# Patient Record
Sex: Female | Born: 1955 | Race: White | Hispanic: No | Marital: Married | Smoking: Never smoker
Health system: Southern US, Community
[De-identification: ages and names within clinical notes are randomized; demographics above are authoritative.]

## PROBLEM LIST (undated history)

## (undated) DIAGNOSIS — I10 Essential (primary) hypertension: Secondary | ICD-10-CM

## (undated) HISTORY — DX: Essential (primary) hypertension: I10

---

## 2003-02-27 HISTORY — PX: ROTATOR CUFF REPAIR: SHX139

## 2012-01-09 DIAGNOSIS — K635 Polyp of colon: Secondary | ICD-10-CM | POA: Insufficient documentation

## 2013-01-28 DIAGNOSIS — Z800 Family history of malignant neoplasm of digestive organs: Secondary | ICD-10-CM | POA: Insufficient documentation

## 2013-01-28 DIAGNOSIS — E559 Vitamin D deficiency, unspecified: Secondary | ICD-10-CM | POA: Insufficient documentation

## 2015-12-29 ENCOUNTER — Ambulatory Visit (INDEPENDENT_AMBULATORY_CARE_PROVIDER_SITE_OTHER): Payer: BLUE CROSS/BLUE SHIELD | Admitting: Osteopathic Medicine

## 2015-12-29 ENCOUNTER — Encounter: Payer: Self-pay | Admitting: Osteopathic Medicine

## 2015-12-29 VITALS — BP 155/80 | HR 68 | Ht 63.0 in | Wt 143.0 lb

## 2015-12-29 DIAGNOSIS — R030 Elevated blood-pressure reading, without diagnosis of hypertension: Secondary | ICD-10-CM

## 2015-12-29 DIAGNOSIS — IMO0001 Reserved for inherently not codable concepts without codable children: Secondary | ICD-10-CM

## 2015-12-29 DIAGNOSIS — M7672 Peroneal tendinitis, left leg: Secondary | ICD-10-CM

## 2015-12-29 DIAGNOSIS — L821 Other seborrheic keratosis: Secondary | ICD-10-CM | POA: Diagnosis not present

## 2015-12-29 MED ORDER — MELOXICAM 7.5 MG PO TABS: 7.5000 mg | ORAL_TABLET | Freq: Every day | ORAL | Status: DC | PRN

## 2015-12-29 NOTE — Progress Notes (Signed)
HPI: Kathleen Kennedy is a 60 y.o. female who presents to [facility] today for chief complaint of:  Chief Complaint  Patient presents with  . Establish Care  . Ankle Pain    ANKLE PAIN . Location: L ankle, posterior to lateral malleolus, not involving achilles tendon . Quality: sore, sharp pain . Duration: started 06/2015 . Timing: worst when walking down stairs . Context: no injury that sh can recall  SPOT ON LEG . Location: L anterior thigh . Quality: dry brownish spot, noticed getting a bit flaky and irritated . Severity: mild . Duration: been there few years . Assoc signs/symptoms: No redness or ulceration  HYPERTENSION - previously on medications however after increasing exercise and losing weight she was discontinued on these meds. No chest pain, pressure, shortness of breath.     Past medical, social and family history reviewed: Past Medical History  Diagnosis Date  . Hypertension    Past Surgical History  Procedure Laterality Date  . Rotator cuff repair  APRIL 2004   Social History  Substance Use Topics  . Smoking status: Not on file  . Smokeless tobacco: Not on file  . Alcohol Use: Not on file   Family History  Problem Relation Age of Onset  . Cancer Mother     SKIN  . Cancer Father     COLON  . Hyperlipidemia Father   . Diabetes Brother     No current outpatient prescriptions on file.   No current facility-administered medications for this visit.   Allergies  Allergen Reactions  . Codeine       Review of Systems: CONSTITUTIONAL:  No  fever, no chills, No  unintentional weight changes HEAD/EYES/EARS/NOSE/THROAT: No  headache, no vision change, no hearing change, No  sore throat, No  sinus pressure CARDIAC: No  chest pain, No  pressure, No palpitations, No  orthopnea RESPIRATORY: No  cough, No  shortness of breath/wheeze GASTROINTESTINAL: No  nausea, No  vomiting, No  abdominal pain, No  blood in stool, No   diarrhea, No  constipation  MUSCULOSKELETAL: (+) myalgia/arthralgia see history of present illness GENITOURINARY: No  incontinence, No  abnormal genital bleeding/discharge SKIN: (+) rash/wounds/concerning lesions see history of present illness HEM/ONC: No  easy bruising/bleeding, No  abnormal lymph node ENDOCRINE: No polyuria/polydipsia/polyphagia, No  heat/cold intolerance  NEUROLOGIC: No  weakness, No  dizziness, No  slurred speech PSYCHIATRIC: No  concerns with depression, No  concerns with anxiety, No sleep problems  Exam:  BP 155/80 mmHg  Pulse 68  Ht  (1.6 m)  Wt 143 lb (64.864 kg)  BMI 25.34 kg/m2 Constitutional: VS see above. General Appearance: alert, well-developed, well-nourished, NAD Eyes: Normal lids and conjunctive, non-icteric sclera, PERRLA Ears, Nose, Mouth, Throat: MMM, Normal external inspection ears/nares/mouth/lips/gums, TM normal bilaterally. Pharynx no erythema, no exudate.  Neck: No masses, trachea midline. No thyroid enlargement/tenderness/mass appreciated. No lymphadenopathy Respiratory: Normal respiratory effort. no wheeze, no rhonchi, no rales Cardiovascular: S1/S2 normal, no murmur, no rub/gallop auscultated. RRR.  Gastrointestinal: Nontender, no masses. No hepatomegaly, no splenomegaly. No hernia appreciated. Bowel sounds normal. Rectal exam deferred.  Musculoskeletal: Gait normal. No clubbing/cyanosis of digits. Ankle drawer test negative, no tenderness to lateral malleolus or Achilles tendon. No ligamentous laxity. Neurological: No cranial nerve deficit on limited exam. Motor and sensation intact and symmetric Skin: warm, dry, intact. No rash/ulcer. No concerning nevi or subq nodules on limited exam.  Small irritated seborrheic keratosis on left thigh Psychiatric: Normal judgment/insight. Normal  mood and affect. Oriented x3.    No results found for this or any previous visit (from the past 72 hour(s)).    ASSESSMENT/PLAN:   Peroneal tendinitis of  lower leg, left - Plan: meloxicam (MOBIC) 7.5 MG tablet - rehabilitation exercises given to patient and patient instructions, will consider physical therapy if no improvement. Medications as needed  Seborrheic keratosis - bothering her can perform cryotherapy, patient declines this today.  Elevated blood pressure - patient has home blood pressure cuff, advised on measuring home BP and breathing based office with blood pressure cuff next week.   Return in about 1 day (around 12/30/2015) for BLOOD PRESSURE RECHECK, BRING HOME CUFF AND HOME BP MEASUREMENTS .

## 2015-12-29 NOTE — Patient Instructions (Addendum)
Peroneal Tendinitis With Rehab Tendonitis is inflammation of a tendon. Inflammation of the tendons on the back of the outer ankle (peroneal tendons) is known as peroneal tendonitis. The peroneal tendons are responsible for connecting the muscles that allow you to stand on your tiptoes to the bones of the ankle. For this reason, peroneal tendonitis often causes pain when trying to complete such motions. Peroneal tendonitis often involves a tear (strain) of the peroneal tendons. Strains are classified into three categories. Grade 1 strains cause pain, but the tendon is not lengthened. Grade 2 strains include a lengthened ligament, due to the ligament being stretched or partially ruptured. With grade 2 strains there is still function, although function may be decreased. Grade 3 strains involve a complete tear of the tendon or muscle, and function is usually impaired. SYMPTOMS   Pain, tenderness, swelling, warmth, or redness over the back of the outer side of the ankle, the outer part of the mid-foot, or the bottom of the arch.  Pain that gets worse with ankle motion (especially when pushing off or pushing down with the front of the foot), or when standing on the ball of the foot or pushing the foot outward.  Crackling sound (crepitation) when the tendon is moved or touched. CAUSES  Peroneal tendinitis occurs when injury to the peroneal tendons causes the body to respond with inflammation. Common causes of injury include:  An overuse injury, in which the groove behind the outer ankle (where the tendon is located) causes wear on the tendon.  A sudden stress placed on the tendon, such as from an increase in the intensity, frequency, or duration of training.  Direct hit (trauma) to the tendon.  Return to activity too soon after a previous ankle injury. RISK INCREASES WITH:  Sports that require sudden, repetitive pushing off of the foot, such as jumping or quick starts.  Kicking and running sports,  especially running down hills or long distances.  Poor strength and flexibility.  Previous injury to the foot, ankle, or leg. PREVENTION  Warm up and stretch properly before activity.  Allow for adequate recovery between workouts.  Maintain physical fitness:  Strength, flexibility, and endurance.  Cardiovascular fitness.  Complete rehabilitation after previous injury. PROGNOSIS  If treated properly, peroneal tendonitis usually heals within 6 weeks.  RELATED COMPLICATIONS  Longer healing time, if not properly treated or if not given enough time to heal.  Recurring symptoms if activity is resumed too soon, with overuse, or when using poor technique.  If untreated, tendinitis may result in tendon rupture, requiring surgery. TREATMENT  Treatment first involves the use of ice and medicine to reduce pain and inflammation. The use of strengthening and stretching exercises may help reduce pain with activity. These exercises may be performed at unsuccessful, surgery to remove the inflamed tendon lining (sheath) may be advised.  MEDICATION   If pain medicine is needed, nonsteroidal anti-inflammatory medicines (aspirin and ibuprofen), or other minor pain relievers (acetaminophen), are often advised.  Do not take pain medicine for 7 days before surgery.  Prescription pain relievers may be given, if your caregiver thinks they are needed. Use only as directed and only as much as you need. HEAT AND COLD  Cold treatment (icing) should be applied for 10 to 15 minutes every 2 to 3 hours for inflammation and pain, and immediately after activity that aggravates your symptoms. Use ice packs or an ice massage.  Heat treatment may be used before performing stretching and strengthening activities prescribed by your   caregiver, physical therapist, or athletic trainer. Use a heat pack or a warm water soak. SEEK MEDICAL CARE IF:  Symptoms get worse or do not improve in 2 to 4 weeks, despite  treatment.  New, unexplained symptoms develop. (Drugs used in treatment may produce side effects.) EXERCISES RANGE OF MOTION (ROM) AND STRETCHING EXERCISES - Peroneal Tendinitis These exercises may help you when beginning to rehabilitate your injury. Your symptoms may resolve with or without further involvement from your physician, physical therapist or athletic trainer. While completing these exercises, remember:   Restoring tissue flexibility helps normal motion to return to the joints. This allows healthier, less painful movement and activity.  An effective stretch should be held for at least 30 seconds.  A stretch should never be painful. You should only feel a gentle lengthening or release in the stretched tissue. RANGE OF MOTION - Ankle Eversion  Sit with your right / left ankle crossed over your opposite knee.  Grip your foot with your opposite hand, placing your thumb on the top of your foot and your fingers across the bottom of your foot.  Gently push your foot downward with a slight rotation, so your littlest toes rise slightly toward the ceiling.  You should feel a gentle stretch on the inside of your ankle. Hold the stretch for __________ seconds. Repeat __________ times. Complete this exercise __________ times per day.  RANGE OF MOTION - Ankle Inversion  Sit with your right / left ankle crossed over your opposite knee.  Grip your foot with your opposite hand, placing your thumb on the bottom of your foot and your fingers across the top of your foot.  Gently pull your foot so the smallest toe comes toward you and your thumb pushes the inside of the ball of your foot away from you.  You should feel a gentle stretch on the outside of your ankle. Hold the stretch for __________ seconds. Repeat __________ times. Complete this exercise __________ times per day.  RANGE OF MOTION - Ankle Plantar Flexion  Sit with your right / left leg crossed over your opposite knee.  Use  your opposite hand to pull the top of your foot and toes toward you.  You should feel a gentle stretch on the top of your foot and ankle. Hold this position for __________ seconds. Repeat __________ times. Complete __________ times per day.  STRETCH - Gastroc, Standing  Place your hands on a wall.  Extend your right / left leg behind you, keeping the front knee somewhat bent.  Slightly point your toes inward on your back foot.  Keeping your right / left heel on the floor and your knee straight, shift your weight toward the wall, not allowing your back to arch.  You should feel a gentle stretch in the calf. Hold this position for __________ seconds. Repeat __________ times. Complete this stretch __________ times per day. STRETCH - Soleus, Standing  Place your hands on a wall.  Extend your right / left leg behind you, keeping the other knee somewhat bent.  Slightly point your toes inward on your back foot.  Keep your heel on the floor, bend your back knee, and slightly shift your weight over the back leg so that you feel a gentle stretch deep in your back calf.  Hold this position for __________ seconds. Repeat __________ times. Complete this stretch __________ times per day. STRETCH - Gastrocsoleus, Standing Note: This exercise can place a lot of stress on your foot and ankle. Please   complete this exercise only if specifically instructed by your caregiver.   Place the ball of your right / left foot on a step, keeping your other foot firmly on the same step.  Hold on to the wall or a rail for balance.  Slowly lift your other foot, allowing your body weight to press your heel down over the edge of the step.  You should feel a stretch in your right / left calf.  Hold this position for __________ seconds.  Repeat this exercise with a slight bend in your knee. Repeat __________ times. Complete this stretch __________ times per day.  STRENGTHENING EXERCISES - Peroneal Tendinitis   These exercises may help you when beginning to rehabilitate your injury. They may resolve your symptoms with or without further involvement from your physician, physical therapist or athletic trainer. While completing these exercises, remember:   Muscles can gain both the endurance and the strength needed for everyday activities through controlled exercises.  Complete these exercises as instructed by your physician, physical therapist or athletic trainer. Increase the resistance and repetitions only as guided by your caregiver. STRENGTH - Dorsiflexors  Secure a rubber exercise band or tubing to a fixed object (table, pole) and loop the other end around your right / left foot.  Sit on the floor facing the fixed object. The band should be slightly tense when your foot is relaxed.  Slowly draw your foot back toward you, using your ankle and toes.  Hold this position for __________ seconds. Slowly release the tension in the band and return your foot to the starting position. Repeat __________ times. Complete this exercise __________ times per day.  STRENGTH - Towel Curls  Sit in a chair, on a non-carpeted surface.  Place your foot on a towel, keeping your heel on the floor.  Pull the towel toward your heel only by curling your toes. Keep your heel on the floor.  If instructed by your physician, physical therapist or athletic trainer, add weight to the end of the towel. Repeat __________ times. Complete this exercise __________ times per day. STRENGTH - Ankle Eversion   Secure one end of a rubber exercise band or tubing to a fixed object (table, pole). Loop the other end around your foot, just before your toes.  Place your fists between your knees. This will focus your strengthening at your ankle.  Drawing the band across your opposite foot, away from the pole, slowly, pull your little toe out and up. Make sure the band is positioned to resist the entire motion.  Hold this position for  __________ seconds.  Have your muscles resist the band, as it slowly pulls your foot back to the starting position. Repeat __________ times. Complete this exercise __________ times per day.    This information is not intended to replace advice given to you by your health care provider. Make sure you discuss any questions you have with your health care provider.   Document Released: 11/14/2005 Document Revised: 03/31/2015 Document Reviewed: 02/26/2009 Elsevier Interactive Patient Education 2016 [employer]  

## 2015-12-30 DIAGNOSIS — L821 Other seborrheic keratosis: Secondary | ICD-10-CM | POA: Insufficient documentation

## 2015-12-30 DIAGNOSIS — R030 Elevated blood-pressure reading, without diagnosis of hypertension: Secondary | ICD-10-CM

## 2015-12-30 DIAGNOSIS — IMO0001 Reserved for inherently not codable concepts without codable children: Secondary | ICD-10-CM | POA: Insufficient documentation

## 2015-12-30 DIAGNOSIS — M7670 Peroneal tendinitis, unspecified leg: Secondary | ICD-10-CM | POA: Insufficient documentation

## 2016-01-12 ENCOUNTER — Ambulatory Visit (INDEPENDENT_AMBULATORY_CARE_PROVIDER_SITE_OTHER): Payer: BLUE CROSS/BLUE SHIELD | Admitting: Osteopathic Medicine

## 2016-01-12 ENCOUNTER — Encounter: Payer: Self-pay | Admitting: Osteopathic Medicine

## 2016-01-12 VITALS — BP 141/90 | HR 80 | Ht 63.0 in | Wt 145.0 lb

## 2016-01-12 DIAGNOSIS — R030 Elevated blood-pressure reading, without diagnosis of hypertension: Secondary | ICD-10-CM

## 2016-01-12 DIAGNOSIS — Z1211 Encounter for screening for malignant neoplasm of colon: Secondary | ICD-10-CM

## 2016-01-12 DIAGNOSIS — Z1239 Encounter for other screening for malignant neoplasm of breast: Secondary | ICD-10-CM

## 2016-01-12 DIAGNOSIS — IMO0001 Reserved for inherently not codable concepts without codable children: Secondary | ICD-10-CM

## 2016-01-12 NOTE — Patient Instructions (Signed)
Colon cancer screening - ask GI if they'd like you to come back for colonoscopy or if they would recommend screening with Cologuard through your primary care doctor. Pap - not needed for another 5 years (November 2021)   Annual Wellness Exam due November 2017 - we will see you then! Please come back sooner if any concerns arise. Let us know if you'd like a referral to physical therapy for the ankle.

## 2016-01-12 NOTE — Progress Notes (Signed)
HPI: Kathleen Kennedy is a 60 y.o. female who presents to [facility] today for chief complaint of:  Chief Complaint  Patient presents with  . Follow-up    BLOOD PRESSURE    Blood pressure recheck: Blood pressure in the clinic as noted below, patient brings her home blood pressure cuff with her and it is verified, see vital signs listed in physical exam below. Patient's home blood pressure medications have been systolic anywhere in the 1 teens to 120s, occasionally into 130s or 140s but this is rare. She denies chest pain, pressure, difficulty breathing.  Review of preventive care: Patient is due for mammogram. Not concerned about hepatitis C or HIV need. Patient states she had colonoscopy sometime ago, not sure what her instructions were regarding follow-up,     Past medical, social and family history reviewed: Past Medical History  Diagnosis Date  . Hypertension    Past Surgical History  Procedure Laterality Date  . Rotator cuff repair  APRIL 2004   Social History  Substance Use Topics  . Smoking status: Not on file  . Smokeless tobacco: Not on file  . Alcohol Use: Not on file   Family History  Problem Relation Age of Onset  . Cancer Mother     SKIN  . Cancer Father     COLON  . Hyperlipidemia Father   . Diabetes Brother     Current Outpatient Prescriptions  Medication Sig Dispense Refill  . meloxicam (MOBIC) 7.5 MG tablet Take 1 tablet (7.5 mg total) by mouth daily as needed for pain. (Patient not taking: Reported on 01/12/2016) 30 tablet 1   No current facility-administered medications for this visit.   Allergies  Allergen Reactions  . Codeine       Review of Systems: CONSTITUTIONAL:  No  fever, no chills, No  unintentional weight changes HEAD/EYES/EARS/NOSE/THROAT: No  headache, no vision change, no hearing change CARDIAC: No  chest pain, No  pressure RESPIRATORY: No  cough, No  shortness of breath  Exam:  BP 141/90 mmHg   Pulse 80  Ht  (1.6 m)  Wt 145 lb (65.772 kg)  BMI 25.69 kg/m2, 136/91 on home BP cuff Constitutional: VS see above. General Appearance: alert, well-developed, well-nourished, NAD Eyes: Normal lids and conjunctive, non-icteric sclera, Respiratory: Normal respiratory effort. no wheeze, no rhonchi, no rales Cardiovascular: S1/S2 normal, no murmur, no rub/gallop auscultated. RRR.  Psychiatric: Normal judgment/insight. Normal mood and affect.    No results found for this or any previous visit (from the past 72 hour(s)).   ASSESSMENT/PLAN:   Elevated blood pressure - Normal based on home measurements with verified cuff, continue to follow routinely at least every 6 months or so.  Breast cancer screening - Plan: MM DIGITAL SCREENING BILATERAL  Colon cancer screening -  I advised her to contact her GI doctor to see if they wanted her to complete another colonoscopy or if they would recommend screen w/ Cologuard   Return in about 9 months (around 09/28/2016) for ANNUAL WELLNESS VISIT/LABS.

## 2016-01-13 DIAGNOSIS — Z1211 Encounter for screening for malignant neoplasm of colon: Secondary | ICD-10-CM | POA: Insufficient documentation

## 2016-01-13 DIAGNOSIS — Z1239 Encounter for other screening for malignant neoplasm of breast: Secondary | ICD-10-CM | POA: Insufficient documentation

## 2017-09-04 ENCOUNTER — Ambulatory Visit (INDEPENDENT_AMBULATORY_CARE_PROVIDER_SITE_OTHER): Payer: BLUE CROSS/BLUE SHIELD | Admitting: Sports Medicine

## 2017-09-04 ENCOUNTER — Ambulatory Visit (INDEPENDENT_AMBULATORY_CARE_PROVIDER_SITE_OTHER): Payer: BLUE CROSS/BLUE SHIELD

## 2017-09-04 DIAGNOSIS — M25511 Pain in right shoulder: Secondary | ICD-10-CM

## 2017-09-04 DIAGNOSIS — M19031 Primary osteoarthritis, right wrist: Secondary | ICD-10-CM | POA: Diagnosis not present

## 2017-09-04 DIAGNOSIS — M25531 Pain in right wrist: Secondary | ICD-10-CM

## 2017-09-04 DIAGNOSIS — M25532 Pain in left wrist: Secondary | ICD-10-CM | POA: Diagnosis not present

## 2017-09-04 DIAGNOSIS — M25512 Pain in left shoulder: Secondary | ICD-10-CM

## 2017-09-04 DIAGNOSIS — G8929 Other chronic pain: Secondary | ICD-10-CM

## 2017-09-04 DIAGNOSIS — M79632 Pain in left forearm: Secondary | ICD-10-CM | POA: Diagnosis not present

## 2017-09-04 MED ORDER — MELOXICAM 15 MG PO TABS: ORAL_TABLET | ORAL | 3 refills | Status: DC

## 2017-09-04 NOTE — Progress Notes (Signed)
   Subjective:    I'm seeing this patient as a consultation for:  Dr. Sunnie Nielsen  CC: Shoulder and wrist pain  HPI: This is a pleasant 61 year old female, she has a history of rotator cuff surgery on the left. For the past several months she's had subacute onset of pain in both shoulders over the deltoids, worse with overhead activities, no trauma, no mechanical symptoms, moderate, persistent.  In addition she's had bilateral wrist pain at the dorsal radiocarpal joint, worse with terminal extension as in doing pushups. Minimal gelling.  Past medical history, Surgical history, Family history not pertinant except as noted below, Social history, Allergies, and medications have been entered into the medical record, reviewed, and no changes needed.   Review of Systems: No headache, visual changes, nausea, vomiting, diarrhea, constipation, dizziness, abdominal pain, skin rash, fevers, chills, night sweats, weight loss, swollen lymph nodes, body aches, joint swelling, muscle aches, chest pain, shortness of breath, mood changes, visual or auditory hallucinations.   Objective:   General: Well Developed, well nourished, and in no acute distress.  Neuro:  Extra-ocular muscles intact, able to move all 4 extremities, sensation grossly intact.  Deep tendon reflexes tested were normal. Psych: Alert and oriented, mood congruent with affect. ENT:  Ears and nose appear unremarkable.  Hearing grossly normal. Neck: Unremarkable overall appearance, trachea midline.  No visible thyroid enlargement. Eyes: Conjunctivae and lids appear unremarkable.  Pupils equal and round. Skin: Warm and dry, no rashes noted.  Cardiovascular: Pulses palpable, no extremity edema. Bilateral shoulders: Inspection reveals no abnormalities, atrophy or asymmetry. Palpation is normal with no tenderness over AC joint or bicipital groove. ROM is full in all planes. Rotator cuff strength normal throughout. Minimally positive Neer  and Hawkin's tests, empty can. Speeds and Yergason's tests normal. No labral pathology noted with negative Obrien's, negative crank, negative clunk, and good stability. Normal scapular function observed. No painful arc and no drop arm sign. No apprehension sign Bilateral wrists: Inspection normal with no visible erythema or swelling. ROM smooth and normal with good flexion and extension and ulnar/radial deviation that is symmetrical with opposite wrist. Mild pain with terminal extension. Palpation is normal over metacarpals, navicular, lunate, and TFCC; tendons without tenderness/ swelling No snuffbox tenderness. No tenderness over Canal of Guyon. Strength 5/5 in all directions without pain. Negative tinel's and phalens signs. Negative Finkelstein sign. Negative Watson's test.  Impression and Recommendations:   This case required medical decision making of moderate complexity.  Bilateral wrist pain X-rays, meloxicam, formal physical therapy. Bilateral wrist braces to be worn when at work.  Bilateral shoulder pain Rotator cuff dysfunction, history of left rotator cuff surgery, unclear as to whether this was a repair or decompression. Bilateral shoulder x-rays, meloxicam, formal physical therapy. Return in one month. ___________________________________________ Ihor Austin. Benjamin Stain, M.D., ABFM., CAQSM. Primary Care and Sports Medicine [facility]  Adjunct Instructor of Family Medicine  [employer]

## 2017-09-04 NOTE — Assessment & Plan Note (Signed)
Rotator cuff dysfunction, history of left rotator cuff surgery, unclear as to whether this was a repair or decompression. Bilateral shoulder x-rays, meloxicam, formal physical therapy. Return in one month.

## 2017-09-04 NOTE — Assessment & Plan Note (Signed)
X-rays, meloxicam, formal physical therapy. Bilateral wrist braces to be worn when at work.

## 2017-09-06 ENCOUNTER — Telehealth: Payer: Self-pay

## 2017-09-06 ENCOUNTER — Encounter (INDEPENDENT_AMBULATORY_CARE_PROVIDER_SITE_OTHER): Payer: Self-pay

## 2017-09-06 NOTE — Telephone Encounter (Signed)
Notified of results

## 2017-09-06 NOTE — Telephone Encounter (Signed)
PT came in today asking about xray results. She would like a call back telling her what the results are for both her wrists and shoulder.

## 2017-10-02 ENCOUNTER — Ambulatory Visit: Payer: BLUE CROSS/BLUE SHIELD | Admitting: Sports Medicine

## 2017-11-01 DIAGNOSIS — K146 Glossodynia: Secondary | ICD-10-CM | POA: Diagnosis not present

## 2017-11-01 DIAGNOSIS — I10 Essential (primary) hypertension: Secondary | ICD-10-CM | POA: Diagnosis not present

## 2018-02-20 DIAGNOSIS — Z13220 Encounter for screening for lipoid disorders: Secondary | ICD-10-CM | POA: Diagnosis not present

## 2018-02-20 DIAGNOSIS — E559 Vitamin D deficiency, unspecified: Secondary | ICD-10-CM | POA: Diagnosis not present

## 2018-02-20 DIAGNOSIS — M79674 Pain in right toe(s): Secondary | ICD-10-CM | POA: Diagnosis not present

## 2018-02-20 DIAGNOSIS — Z0000 Encounter for general adult medical examination without abnormal findings: Secondary | ICD-10-CM | POA: Diagnosis not present

## 2018-02-20 DIAGNOSIS — R030 Elevated blood-pressure reading, without diagnosis of hypertension: Secondary | ICD-10-CM | POA: Diagnosis not present

## 2018-02-27 DIAGNOSIS — Z1231 Encounter for screening mammogram for malignant neoplasm of breast: Secondary | ICD-10-CM | POA: Diagnosis not present

## 2018-04-10 DIAGNOSIS — M5442 Lumbago with sciatica, left side: Secondary | ICD-10-CM | POA: Diagnosis not present

## 2018-04-10 DIAGNOSIS — M9902 Segmental and somatic dysfunction of thoracic region: Secondary | ICD-10-CM | POA: Diagnosis not present

## 2018-04-10 DIAGNOSIS — M9903 Segmental and somatic dysfunction of lumbar region: Secondary | ICD-10-CM | POA: Diagnosis not present

## 2018-04-10 DIAGNOSIS — M4724 Other spondylosis with radiculopathy, thoracic region: Secondary | ICD-10-CM | POA: Diagnosis not present

## 2018-04-10 DIAGNOSIS — M5441 Lumbago with sciatica, right side: Secondary | ICD-10-CM | POA: Diagnosis not present

## 2018-04-10 DIAGNOSIS — M9904 Segmental and somatic dysfunction of sacral region: Secondary | ICD-10-CM | POA: Diagnosis not present

## 2018-07-03 ENCOUNTER — Encounter: Payer: Self-pay | Admitting: Sports Medicine

## 2018-07-03 ENCOUNTER — Ambulatory Visit: Payer: BLUE CROSS/BLUE SHIELD | Admitting: Sports Medicine

## 2018-07-03 ENCOUNTER — Ambulatory Visit (INDEPENDENT_AMBULATORY_CARE_PROVIDER_SITE_OTHER): Payer: BLUE CROSS/BLUE SHIELD

## 2018-07-03 DIAGNOSIS — M25461 Effusion, right knee: Secondary | ICD-10-CM

## 2018-07-03 DIAGNOSIS — M1712 Unilateral primary osteoarthritis, left knee: Secondary | ICD-10-CM | POA: Diagnosis not present

## 2018-07-03 DIAGNOSIS — M25462 Effusion, left knee: Secondary | ICD-10-CM | POA: Diagnosis not present

## 2018-07-03 NOTE — Progress Notes (Signed)
Subjective:    I'm seeing this patient as a consultation for: Dr. Sunnie Nielsen  CC: Right knee injury  HPI: Several days ago this pleasant 62 year old female was playing kickball, she went to throw the ball, twisted her right knee, had immediate pain, swelling without bruising.  She was able to continue to walk on the knee.  Swelling is improved slightly but she continues to have medial joint line discomfort.  Moderate, persistent without radiation.  I reviewed the past medical history, family history, social history, surgical history, and allergies today and no changes were needed.  Please see the problem list section below in epic for further details.  Past Medical History: Past Medical History:  Diagnosis Date  . Hypertension    Past Surgical History: Past Surgical History:  Procedure Laterality Date  . ROTATOR CUFF REPAIR  APRIL 2004   Social History: Social History   Socioeconomic History  . Marital status: Married    Spouse name: Not on file  . Number of children: Not on file  . Years of education: Not on file  . Highest education level: Not on file  Occupational History  . Not on file  Social Needs  . Financial resource strain: Not on file  . Food insecurity:    Worry: Not on file    Inability: Not on file  . Transportation needs:    Medical: Not on file    Non-medical: Not on file  Tobacco Use  . Smoking status: Never Smoker  . Smokeless tobacco: Never Used  Substance and Sexual Activity  . Alcohol use: Not Currently    Alcohol/week: 0.0 oz  . Drug use: Never  . Sexual activity: Not on file  Lifestyle  . Physical activity:    Days per week: Not on file    Minutes per session: Not on file  . Stress: Not on file  Relationships  . Social connections:    Talks on phone: Not on file    Gets together: Not on file    Attends religious service: Not on file    Active member of club or organization: Not on file    Attends meetings of clubs or  organizations: Not on file    Relationship status: Not on file  Other Topics Concern  . Not on file  Social History Narrative  . Not on file   Family History: Family History  Problem Relation Age of Onset  . Cancer Mother        SKIN  . Cancer Father        COLON  . Hyperlipidemia Father   . Diabetes Brother    Allergies: Allergies  Allergen Reactions  . Codeine    Medications: See med rec.  Review of Systems: No headache, visual changes, nausea, vomiting, diarrhea, constipation, dizziness, abdominal pain, skin rash, fevers, chills, night sweats, weight loss, swollen lymph nodes, body aches, joint swelling, muscle aches, chest pain, shortness of breath, mood changes, visual or auditory hallucinations.   Objective:   General: Well Developed, well nourished, and in no acute distress.  Neuro:  Extra-ocular muscles intact, able to move all 4 extremities, sensation grossly intact.  Deep tendon reflexes tested were normal. Psych: Alert and oriented, mood congruent with affect. ENT:  Ears and nose appear unremarkable.  Hearing grossly normal. Neck: Unremarkable overall appearance, trachea midline.  No visible thyroid enlargement. Eyes: Conjunctivae and lids appear unremarkable.  Pupils equal and round. Skin: Warm and dry, no rashes noted.  Cardiovascular: Pulses  palpable, no extremity edema. Right knee: Moderate effusion Palpation normal with no warmth or joint line tenderness or patellar tenderness or condyle tenderness. ROM normal in flexion and extension and lower leg rotation. Ligaments with solid consistent endpoints including ACL, PCL, LCL, MCL. Negative Mcmurray's and provocative meniscal tests. Non painful patellar compression. Patellar and quadriceps tendons unremarkable. Hamstring and quadriceps strength is normal.  Procedure: Real-time Ultrasound Guided aspiration/injection of right knee Device: GE Logiq E  Verbal informed consent obtained.  Time-out conducted.    Noted no overlying erythema, induration, or other signs of local infection.  Skin prepped in a sterile fashion.  Local anesthesia: Topical Ethyl chloride.  With sterile technique and under real time ultrasound guidance: Using an 18-gauge needle advanced into the suprapatellar recess, aspirated approximately 10 mL of cloudy, straw-colored fluid, syringe switched and 1 cc Kenalog 40, 2 cc lidocaine, 2 cc bupivacaine injected easily Completed without difficulty  Pain immediately resolved suggesting accurate placement of the medication.  Advised to call if fevers/chills, erythema, induration, drainage, or persistent bleeding.  Images permanently stored and available for review in the ultrasound unit.  Impression: Technically successful ultrasound guided injection.  Impression and Recommendations:   This case required medical decision making of moderate complexity.  Effusion of right knee Aspiration and injection. X-rays. Aspirate was cloudy, adding crystal analysis. Return to see me in a month. ___________________________________________ Ihor Austinhomas J. Benjamin Stainhekkekandam, M.D., ABFM., CAQSM. Primary Care and Sports Medicine [facility]  Adjunct Instructor of Family Medicine  [employer]

## 2018-07-03 NOTE — Assessment & Plan Note (Addendum)
Aspiration and injection. X-rays. Aspirate was cloudy, adding crystal analysis. Return to see me in a month.

## 2018-07-04 LAB — CELL COUNT + DIFF, W/O CRYST-SYNVL FLD
Basophils, %: 0 %
Eosinophils-Synovial: 0 % (ref 0–2)
Lymphocytes-Synovial Fld: 22 % (ref 0–74)
Monocyte/Macrophage: 43 % (ref 0–69)
Neutrophil, Synovial: 35 % — ABNORMAL HIGH (ref 0–24)
Synoviocytes, %: 0 % (ref 0–15)
WBC, Synovial: 174 cells/uL — ABNORMAL HIGH (ref <150)

## 2018-07-04 LAB — SYNOVIAL FLUID, CRYSTAL

## 2018-07-20 ENCOUNTER — Encounter: Payer: Self-pay | Admitting: Sports Medicine

## 2018-07-20 ENCOUNTER — Ambulatory Visit: Payer: BLUE CROSS/BLUE SHIELD | Admitting: Sports Medicine

## 2018-07-20 DIAGNOSIS — M1612 Unilateral primary osteoarthritis, left hip: Secondary | ICD-10-CM | POA: Insufficient documentation

## 2018-07-20 DIAGNOSIS — M76892 Other specified enthesopathies of left lower limb, excluding foot: Secondary | ICD-10-CM

## 2018-07-20 DIAGNOSIS — M25461 Effusion, right knee: Secondary | ICD-10-CM | POA: Diagnosis not present

## 2018-07-20 DIAGNOSIS — M7062 Trochanteric bursitis, left hip: Secondary | ICD-10-CM | POA: Insufficient documentation

## 2018-07-20 MED ORDER — CELECOXIB 200 MG PO CAPS: ORAL_CAPSULE | ORAL | 2 refills | Status: DC

## 2018-07-20 NOTE — Progress Notes (Signed)
Subjective:    I'm seeing this patient as a consultation for: Dr. Sunnie NielsenNatalie Alexander  CC: Left thigh pain  HPI: This is a pleasant 62 year old female, we have been treating her for right knee osteoarthritis, she is overall doing well after an aspiration and injection at the last visit.  She has been playing kickball, and has been doing some of the exercise classes downstairs.  Last week she noted discomfort over her anterior upper thigh, worse with picking up her leg.  Moderate, persistent without radiation, no mechanical symptoms, no trauma.  I reviewed the past medical history, family history, social history, surgical history, and allergies today and no changes were needed.  Please see the problem list section below in epic for further details.  Past Medical History: Past Medical History:  Diagnosis Date  . Hypertension    Past Surgical History: Past Surgical History:  Procedure Laterality Date  . ROTATOR CUFF REPAIR  APRIL 2004   Social History: Social History   Socioeconomic History  . Marital status: Married    Spouse name: Not on file  . Number of children: Not on file  . Years of education: Not on file  . Highest education level: Not on file  Occupational History  . Not on file  Social Needs  . Financial resource strain: Not on file  . Food insecurity:    Worry: Not on file    Inability: Not on file  . Transportation needs:    Medical: Not on file    Non-medical: Not on file  Tobacco Use  . Smoking status: Never Smoker  . Smokeless tobacco: Never Used  Substance and Sexual Activity  . Alcohol use: Not Currently    Alcohol/week: 0.0 standard drinks  . Drug use: Never  . Sexual activity: Not on file  Lifestyle  . Physical activity:    Days per week: Not on file    Minutes per session: Not on file  . Stress: Not on file  Relationships  . Social connections:    Talks on phone: Not on file    Gets together: Not on file    Attends religious service: Not on  file    Active member of club or organization: Not on file    Attends meetings of clubs or organizations: Not on file    Relationship status: Not on file  Other Topics Concern  . Not on file  Social History Narrative  . Not on file   Family History: Family History  Problem Relation Age of Onset  . Cancer Mother        SKIN  . Cancer Father        COLON  . Hyperlipidemia Father   . Diabetes Brother    Allergies: Allergies  Allergen Reactions  . Codeine    Medications: See med rec.  Review of Systems: No headache, visual changes, nausea, vomiting, diarrhea, constipation, dizziness, abdominal pain, skin rash, fevers, chills, night sweats, weight loss, swollen lymph nodes, body aches, joint swelling, muscle aches, chest pain, shortness of breath, mood changes, visual or auditory hallucinations.   Objective:   General: Well Developed, well nourished, and in no acute distress.  Neuro:  Extra-ocular muscles intact, able to move all 4 extremities, sensation grossly intact.  Deep tendon reflexes tested were normal. Psych: Alert and oriented, mood congruent with affect. ENT:  Ears and nose appear unremarkable.  Hearing grossly normal. Neck: Unremarkable overall appearance, trachea midline.  No visible thyroid enlargement. Eyes: Conjunctivae and lids  appear unremarkable.  Pupils equal and round. Skin: Warm and dry, no rashes noted.  Cardiovascular: Pulses palpable, no extremity edema. Left hip: ROM IR: 60 Deg, ER: 60 Deg, Flexion: 120 Deg, Extension: 100 Deg, Abduction: 45 Deg, Adduction: 45 Deg Strength IR: 5/5, ER: 5/5, Flexion: 4/5 with reproduction of pain on resisted flexion, Extension: 5/5, Abduction: 5/5, Adduction: 5/5 Pelvic alignment unremarkable to inspection and palpation. Standing hip rotation and gait without trendelenburg / unsteadiness. Greater trochanter without tenderness to palpation. No tenderness over piriformis. No SI joint tenderness and normal minimal SI  movement. Right knee: Normal to inspection with no erythema or effusion or obvious bony abnormalities. Palpation normal with no warmth or joint line tenderness or patellar tenderness or condyle tenderness. ROM normal in flexion and extension and lower leg rotation. Ligaments with solid consistent endpoints including ACL, PCL, LCL, MCL. Negative Mcmurray's and provocative meniscal tests. Non painful patellar compression. Patellar and quadriceps tendons unremarkable. Hamstring and quadriceps strength is normal.  Impression and Recommendations:   This case required medical decision making of moderate complexity.  Effusion of right knee Overall doing much better after guided aspiration and injection. Crystal analysis was negative. Still has only a bit of discomfort at the medial joint line. Adding knee osteoarthritis rehabilitation exercises, Celebrex.  Tendinitis of left hip flexor Classic hip flexor tendinitis. Hip flexor rehab given, Celebrex. Avoid kickball for the next week or 2. Return to see me if no better in a couple of weeks. ___________________________________________ Ihor Austin. Benjamin Stain, M.D., ABFM., CAQSM. Primary Care and Sports Medicine [facility]  Adjunct Instructor of Family Medicine  [employer]

## 2018-07-20 NOTE — Assessment & Plan Note (Signed)
Classic hip flexor tendinitis. Hip flexor rehab given, Celebrex. Avoid kickball for the next week or 2. Return to see me if no better in a couple of weeks.

## 2018-07-20 NOTE — Assessment & Plan Note (Signed)
Overall doing much better after guided aspiration and injection. Crystal analysis was negative. Still has only a bit of discomfort at the medial joint line. Adding knee osteoarthritis rehabilitation exercises, Celebrex.

## 2018-08-03 ENCOUNTER — Ambulatory Visit: Payer: BLUE CROSS/BLUE SHIELD | Admitting: Sports Medicine

## 2018-08-17 ENCOUNTER — Ambulatory Visit: Payer: BLUE CROSS/BLUE SHIELD | Admitting: Sports Medicine

## 2018-12-03 ENCOUNTER — Encounter: Payer: Self-pay | Admitting: Sports Medicine

## 2018-12-03 ENCOUNTER — Ambulatory Visit (INDEPENDENT_AMBULATORY_CARE_PROVIDER_SITE_OTHER): Payer: BLUE CROSS/BLUE SHIELD

## 2018-12-03 ENCOUNTER — Ambulatory Visit: Payer: BLUE CROSS/BLUE SHIELD | Admitting: Sports Medicine

## 2018-12-03 DIAGNOSIS — M1612 Unilateral primary osteoarthritis, left hip: Secondary | ICD-10-CM

## 2018-12-03 DIAGNOSIS — R1033 Periumbilical pain: Secondary | ICD-10-CM | POA: Diagnosis not present

## 2018-12-03 DIAGNOSIS — M25461 Effusion, right knee: Secondary | ICD-10-CM

## 2018-12-03 NOTE — Progress Notes (Signed)
Subjective:    CC: Follow-up  HPI: This is a pleasant 63 year old female, right knee osteoarthritis, previous aspiration and injection 6 months ago provided good relief, now having recurrence of discomfort.  Left hip pain: Localized anteriorly over the groin, moderate gelling.  Localized without radiation.  I reviewed the past medical history, family history, social history, surgical history, and allergies today and no changes were needed.  Please see the problem list section below in epic for further details.  Past Medical History: Past Medical History:  Diagnosis Date  . Hypertension    Past Surgical History: Past Surgical History:  Procedure Laterality Date  . ROTATOR CUFF REPAIR  APRIL 2004   Social History: Social History   Socioeconomic History  . Marital status: Married    Spouse name: Not on file  . Number of children: Not on file  . Years of education: Not on file  . Highest education level: Not on file  Occupational History  . Not on file  Social Needs  . Financial resource strain: Not on file  . Food insecurity:    Worry: Not on file    Inability: Not on file  . Transportation needs:    Medical: Not on file    Non-medical: Not on file  Tobacco Use  . Smoking status: Never Smoker  . Smokeless tobacco: Never Used  Substance and Sexual Activity  . Alcohol use: Not Currently    Alcohol/week: 0.0 standard drinks  . Drug use: Never  . Sexual activity: Not on file  Lifestyle  . Physical activity:    Days per week: Not on file    Minutes per session: Not on file  . Stress: Not on file  Relationships  . Social connections:    Talks on phone: Not on file    Gets together: Not on file    Attends religious service: Not on file    Active member of club or organization: Not on file    Attends meetings of clubs or organizations: Not on file    Relationship status: Not on file  Other Topics Concern  . Not on file  Social History Narrative  . Not on file    Family History: Family History  Problem Relation Age of Onset  . Cancer Mother        SKIN  . Cancer Father        COLON  . Hyperlipidemia Father   . Diabetes Brother    Allergies: Allergies  Allergen Reactions  . Codeine    Medications: See med rec.  Review of Systems: No fevers, chills, night sweats, weight loss, chest pain, or shortness of breath.   Objective:    General: Well Developed, well nourished, and in no acute distress.  Neuro: Alert and oriented x3, extra-ocular muscles intact, sensation grossly intact.  HEENT: Normocephalic, atraumatic, pupils equal round reactive to light, neck supple, no masses, no lymphadenopathy, thyroid nonpalpable.  Skin: Warm and dry, no rashes. Cardiac: Regular rate and rhythm, no murmurs rubs or gallops, no lower extremity edema.  Respiratory: Clear to auscultation bilaterally. Not using accessory muscles, speaking in full sentences. Right knee: Swollen, palpable fluid wave and effusion, tender at the medial joint line ROM normal in flexion and extension and lower leg rotation. Ligaments with solid consistent endpoints including ACL, PCL, LCL, MCL. Negative Mcmurray's and provocative meniscal tests. Non painful patellar compression. Patellar and quadriceps tendons unremarkable. Hamstring and quadriceps strength is normal. Left hip: ROM IR: 60 Deg good range of  motion but reproduction of pain with internal rotation, ER: 60 Deg, Flexion: 120 Deg, Extension: 100 Deg, Abduction: 45 Deg, Adduction: 45 Deg Strength IR: 5/5, ER: 5/5, Flexion: 5/5, Extension: 5/5, Abduction: 5/5, Adduction: 5/5 Pelvic alignment unremarkable to inspection and palpation. Standing hip rotation and gait without trendelenburg / unsteadiness. Greater trochanter without tenderness to palpation. No tenderness over piriformis. No SI joint tenderness and normal minimal SI movement.  X-rays personally reviewed, mild joint space narrowing and subchondral sclerosis  on the left hip.  Impression and Recommendations:    Primary osteoarthritis of left hip X-rays, restart meloxicam, exercises given. Return in a month, injection if no better.  Effusion of right knee Suspect osteoarthritis and meniscal injury. Did well with aspiration and injection almost 6 months ago. Now having recurrence of effusion. Restart meloxicam, ice for 20 minutes 3-4 times a day, rehab exercises given. If no better in 2 to 4 weeks we will do an injection. Her goal is to get back to running. I did recommend lower impact activities and powerwalking for now. ___________________________________________ Ihor Austin. Benjamin Stain, M.D., ABFM., CAQSM. Primary Care and Sports Medicine [facility]  Adjunct Professor of Family Medicine  [employer]

## 2018-12-03 NOTE — Assessment & Plan Note (Signed)
X-rays, restart meloxicam, exercises given. Return in a month, injection if no better.

## 2018-12-03 NOTE — Assessment & Plan Note (Signed)
Suspect osteoarthritis and meniscal injury. Did well with aspiration and injection almost 6 months ago. Now having recurrence of effusion. Restart meloxicam, ice for 20 minutes 3-4 times a day, rehab exercises given. If no better in 2 to 4 weeks we will do an injection. Her goal is to get back to running. I did recommend lower impact activities and powerwalking for now.

## 2018-12-10 ENCOUNTER — Other Ambulatory Visit: Payer: Self-pay | Admitting: Sports Medicine

## 2018-12-10 DIAGNOSIS — M25532 Pain in left wrist: Secondary | ICD-10-CM

## 2018-12-10 DIAGNOSIS — M25531 Pain in right wrist: Secondary | ICD-10-CM

## 2018-12-10 NOTE — Telephone Encounter (Signed)
Last office note states for pt to restart Meloxicam but RX has not been written since 09/04/2017  RX pended, please verify dose and directions and send  Thanks!

## 2018-12-31 ENCOUNTER — Ambulatory Visit: Payer: BLUE CROSS/BLUE SHIELD | Admitting: Sports Medicine

## 2018-12-31 ENCOUNTER — Encounter: Payer: Self-pay | Admitting: Sports Medicine

## 2018-12-31 DIAGNOSIS — M25461 Effusion, right knee: Secondary | ICD-10-CM

## 2018-12-31 DIAGNOSIS — M1612 Unilateral primary osteoarthritis, left hip: Secondary | ICD-10-CM

## 2018-12-31 MED ORDER — DICLOFENAC SODIUM 2 % TD SOLN: 2.0000 | Freq: Two times a day (BID) | TRANSDERMAL | 11 refills | Status: DC

## 2018-12-31 NOTE — Assessment & Plan Note (Signed)
Persistent mild discomfort. Mild effusion. Continue meloxicam as needed, icing as needed. She will get more diligent with her rehab exercises. Adding topical Pennsaid samples. She does have a 5K in the middle of April so if an injection is needed we will do it in the middle of March.

## 2018-12-31 NOTE — Assessment & Plan Note (Signed)
Resolved with meloxicam and rehab exercises.

## 2018-12-31 NOTE — Progress Notes (Signed)
Subjective:    CC: Follow-up follow-up  HPI: Left hip osteoarthritis: Resolved with meloxicam and rehab exercises.  She also has right knee swelling and pain, likely osteoarthritis.  This is only a little bit better but she does admit that she can be a bit more diligent with her rehab exercises.  She does have a 5K that she like to do in April.  I reviewed the past medical history, family history, social history, surgical history, and allergies today and no changes were needed.  Please see the problem list section below in epic for further details.  Past Medical History: Past Medical History:  Diagnosis Date  . Hypertension    Past Surgical History: Past Surgical History:  Procedure Laterality Date  . ROTATOR CUFF REPAIR  APRIL 2004   Social History: Social History   Socioeconomic History  . Marital status: Married    Spouse name: Not on file  . Number of children: Not on file  . Years of education: Not on file  . Highest education level: Not on file  Occupational History  . Not on file  Social Needs  . Financial resource strain: Not on file  . Food insecurity:    Worry: Not on file    Inability: Not on file  . Transportation needs:    Medical: Not on file    Non-medical: Not on file  Tobacco Use  . Smoking status: Never Smoker  . Smokeless tobacco: Never Used  Substance and Sexual Activity  . Alcohol use: Not Currently    Alcohol/week: 0.0 standard drinks  . Drug use: Never  . Sexual activity: Not on file  Lifestyle  . Physical activity:    Days per week: Not on file    Minutes per session: Not on file  . Stress: Not on file  Relationships  . Social connections:    Talks on phone: Not on file    Gets together: Not on file    Attends religious service: Not on file    Active member of club or organization: Not on file    Attends meetings of clubs or organizations: Not on file    Relationship status: Not on file  Other Topics Concern  . Not on file    Social History Narrative  . Not on file   Family History: Family History  Problem Relation Age of Onset  . Cancer Mother        SKIN  . Cancer Father        COLON  . Hyperlipidemia Father   . Diabetes Brother    Allergies: Allergies  Allergen Reactions  . Codeine    Medications: See med rec.  Review of Systems: No fevers, chills, night sweats, weight loss, chest pain, or shortness of breath.   Objective:    General: Well Developed, well nourished, and in no acute distress.  Neuro: Alert and oriented x3, extra-ocular muscles intact, sensation grossly intact.  HEENT: Normocephalic, atraumatic, pupils equal round reactive to light, neck supple, no masses, no lymphadenopathy, thyroid nonpalpable.  Skin: Warm and dry, no rashes. Cardiac: Regular rate and rhythm, no murmurs rubs or gallops, no lower extremity edema.  Respiratory: Clear to auscultation bilaterally. Not using accessory muscles, speaking in full sentences. Right knee: Only mild swelling, no real discrete areas of tenderness today. ROM normal in flexion and extension and lower leg rotation. Ligaments with solid consistent endpoints including ACL, PCL, LCL, MCL. Negative Mcmurray's and provocative meniscal tests. Non painful patellar compression. Patellar and  quadriceps tendons unremarkable. Hamstring and quadriceps strength is normal.  Impression and Recommendations:    Primary osteoarthritis of left hip Resolved with meloxicam and rehab exercises.  Effusion of right knee Persistent mild discomfort. Mild effusion. Continue meloxicam as needed, icing as needed. She will get more diligent with her rehab exercises. Adding topical Pennsaid samples. She does have a 5K in the middle of April so if an injection is needed we will do it in the middle of March.   ___________________________________________ Ihor Austinhomas J. Benjamin Stainhekkekandam, M.D., ABFM., CAQSM. Primary Care and Sports Medicine [facility]  [facility]  Adjunct Professor of Family Medicine  [employer]

## 2019-02-12 ENCOUNTER — Ambulatory Visit: Payer: BLUE CROSS/BLUE SHIELD | Admitting: Sports Medicine

## 2019-04-04 IMAGING — DX DG WRIST COMPLETE 3+V*L*
4 series · 4 of 4 positions shown · non-contrast
Comparison: None.

CLINICAL DATA: Wrist pain

EXAM:
LEFT WRIST - COMPLETE 3+ VIEW

[wrist pa]
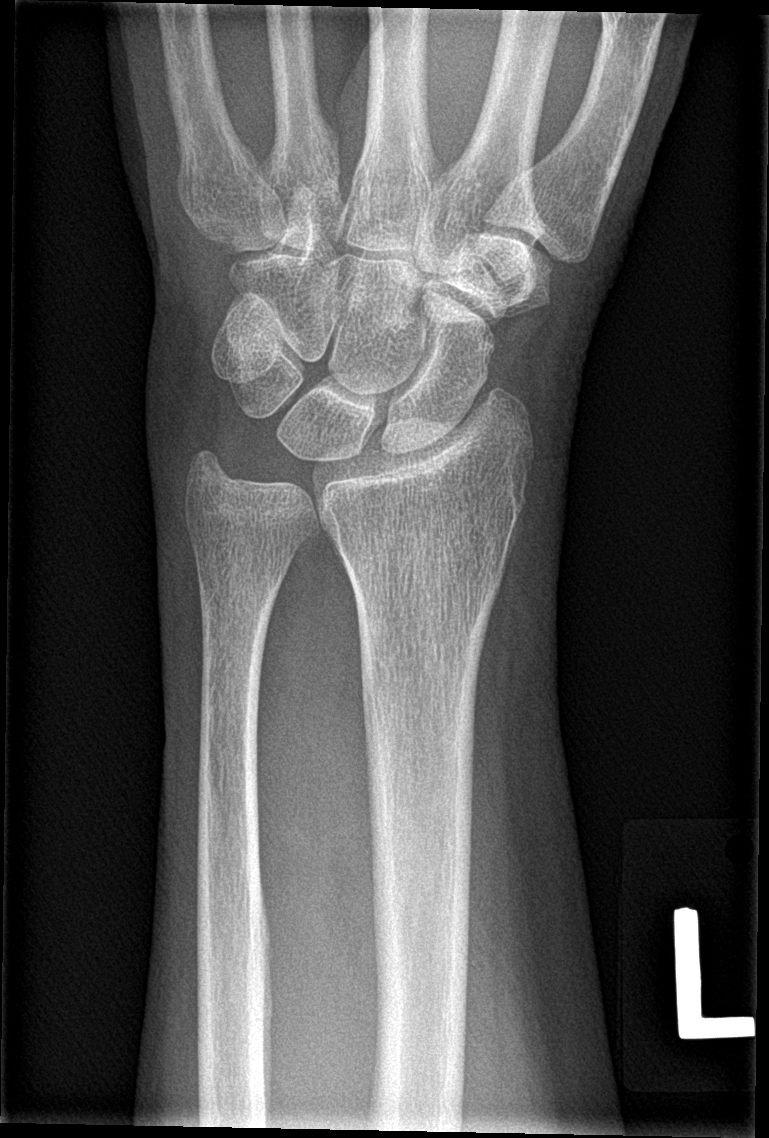

[wrist obl]
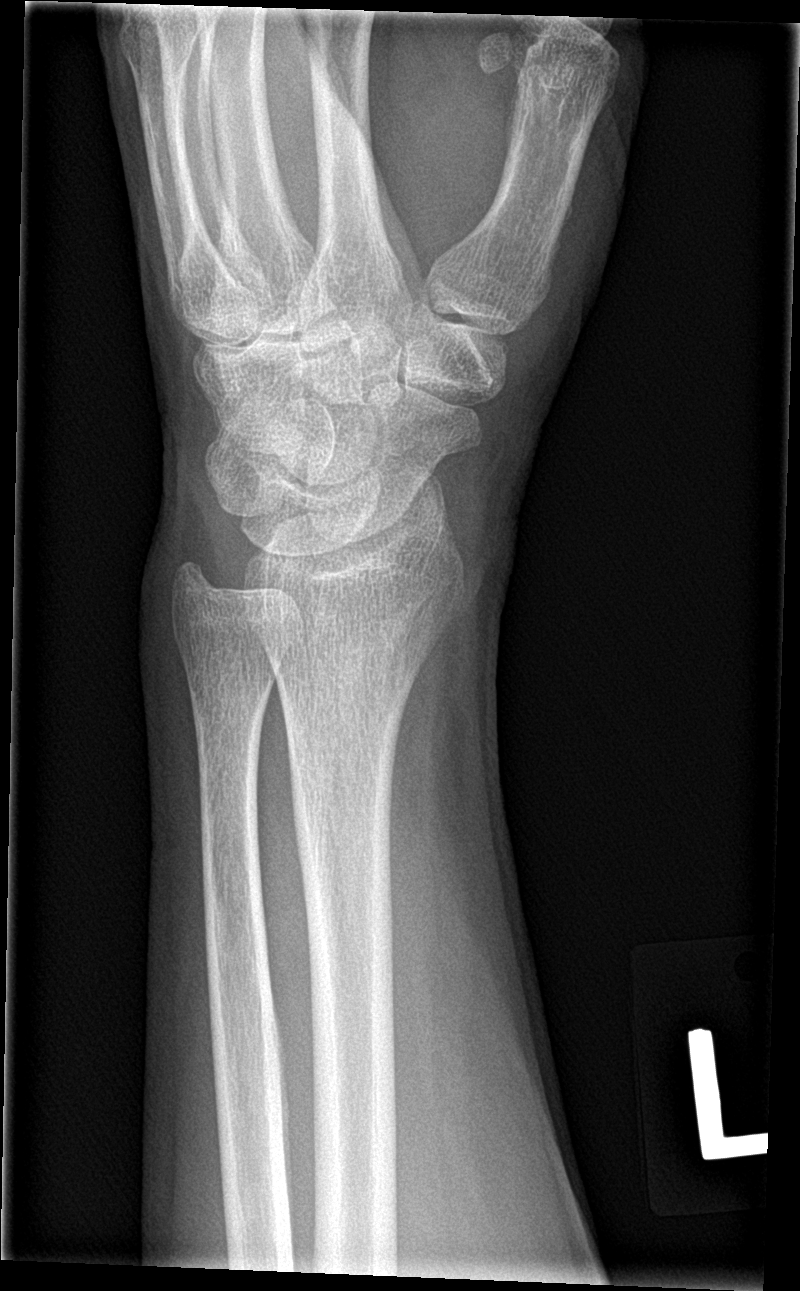

[wrist lat]
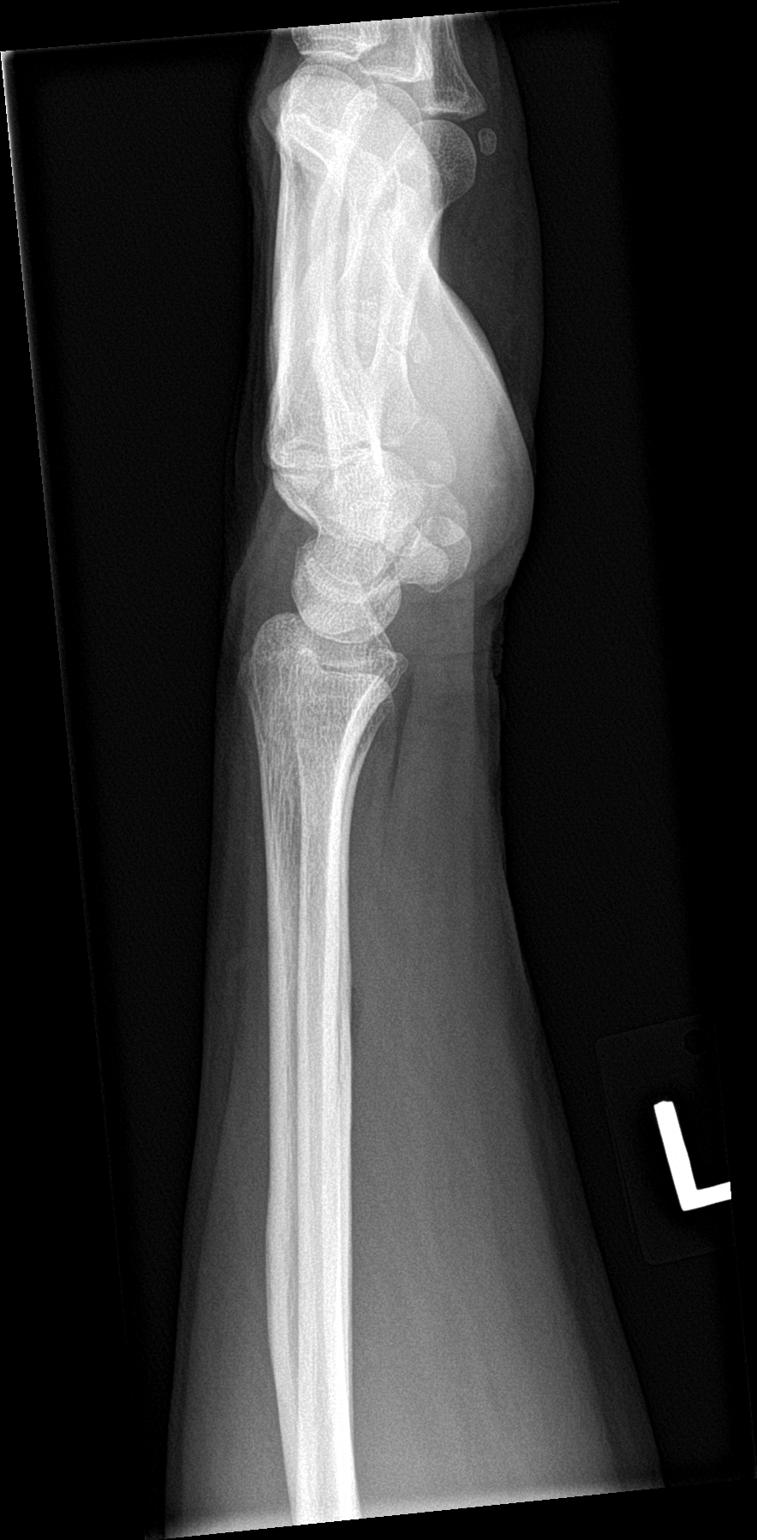

[wrist navicular]
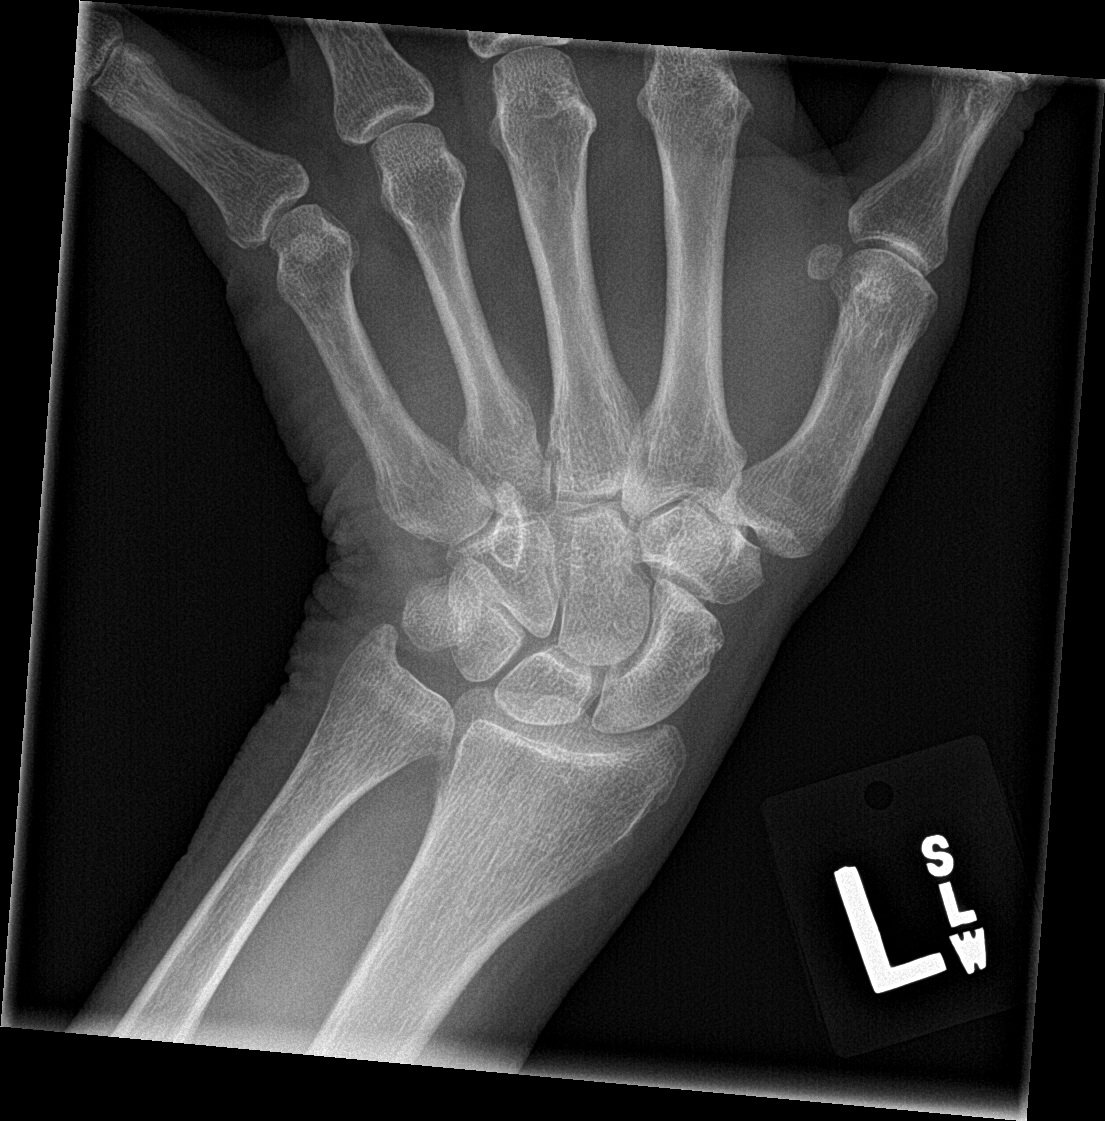

[4 of 4 positions shown; findings below may reference images not displayed]

FINDINGS: There is no evidence of fracture or dislocation. There is no
evidence of arthropathy or other focal bone abnormality. Soft
tissues are unremarkable.
IMPRESSION: No evidence for osteoarthritis.

## 2019-04-24 ENCOUNTER — Ambulatory Visit (INDEPENDENT_AMBULATORY_CARE_PROVIDER_SITE_OTHER): Payer: BLUE CROSS/BLUE SHIELD

## 2019-04-24 ENCOUNTER — Other Ambulatory Visit: Payer: Self-pay

## 2019-04-24 ENCOUNTER — Encounter: Payer: Self-pay | Admitting: Sports Medicine

## 2019-04-24 ENCOUNTER — Ambulatory Visit (INDEPENDENT_AMBULATORY_CARE_PROVIDER_SITE_OTHER): Payer: BLUE CROSS/BLUE SHIELD | Admitting: Sports Medicine

## 2019-04-24 DIAGNOSIS — M25562 Pain in left knee: Secondary | ICD-10-CM | POA: Diagnosis not present

## 2019-04-24 DIAGNOSIS — M25461 Effusion, right knee: Secondary | ICD-10-CM | POA: Diagnosis not present

## 2019-04-24 NOTE — Assessment & Plan Note (Signed)
Recurrent right knee effusion. Aspiration and injection. Repeat x-rays. Return to see me in a month, MRI if not sufficiently better.

## 2019-04-24 NOTE — Progress Notes (Signed)
Subjective:    CC: Right knee injury  HPI: This is a pleasant 63 year old female, she was helping her husband move some furniture, unfortunately she developed swelling and pain in the anterior aspect of her knee, moderate, persistent, localized without radiation.  I reviewed the past medical history, family history, social history, surgical history, and allergies today and no changes were needed.  Please see the problem list section below in epic for further details.  Past Medical History: Past Medical History:  Diagnosis Date  . Hypertension    Past Surgical History: Past Surgical History:  Procedure Laterality Date  . ROTATOR CUFF REPAIR  APRIL 2004   Social History: Social History   Socioeconomic History  . Marital status: Married    Spouse name: Not on file  . Number of children: Not on file  . Years of education: Not on file  . Highest education level: Not on file  Occupational History  . Not on file  Social Needs  . Financial resource strain: Not on file  . Food insecurity:    Worry: Not on file    Inability: Not on file  . Transportation needs:    Medical: Not on file    Non-medical: Not on file  Tobacco Use  . Smoking status: Never Smoker  . Smokeless tobacco: Never Used  Substance and Sexual Activity  . Alcohol use: Not Currently    Alcohol/week: 0.0 standard drinks  . Drug use: Never  . Sexual activity: Not on file  Lifestyle  . Physical activity:    Days per week: Not on file    Minutes per session: Not on file  . Stress: Not on file  Relationships  . Social connections:    Talks on phone: Not on file    Gets together: Not on file    Attends religious service: Not on file    Active member of club or organization: Not on file    Attends meetings of clubs or organizations: Not on file    Relationship status: Not on file  Other Topics Concern  . Not on file  Social History Narrative  . Not on file   Family History: Family History  Problem  Relation Age of Onset  . Cancer Mother        SKIN  . Cancer Father        COLON  . Hyperlipidemia Father   . Diabetes Brother    Allergies: Allergies  Allergen Reactions  . Codeine    Medications: See med rec.  Review of Systems: No fevers, chills, night sweats, weight loss, chest pain, or shortness of breath.   Objective:    General: Well Developed, well nourished, and in no acute distress.  Neuro: Alert and oriented x3, extra-ocular muscles intact, sensation grossly intact.  HEENT: Normocephalic, atraumatic, pupils equal round reactive to light, neck supple, no masses, no lymphadenopathy, thyroid nonpalpable.  Skin: Warm and dry, no rashes. Cardiac: Regular rate and rhythm, no murmurs rubs or gallops, no lower extremity edema.  Respiratory: Clear to auscultation bilaterally. Not using accessory muscles, speaking in full sentences. Right knee: Visibly swollen, palpable effusion with a fluid wave, tender at the patellar facets. ROM normal in flexion and extension and lower leg rotation. Ligaments with solid consistent endpoints including ACL, PCL, LCL, MCL. Negative Mcmurray's and provocative meniscal tests. Non painful patellar compression. Patellar and quadriceps tendons unremarkable. Hamstring and quadriceps strength is normal.  Procedure: Real-time Ultrasound Guided aspiration/injection of right knee Device: GE Logiq E  Verbal informed consent obtained.  Time-out conducted.  Noted no overlying erythema, induration, or other signs of local infection.  Skin prepped in a sterile fashion.  Local anesthesia: Topical Ethyl chloride.  With sterile technique and under real time ultrasound guidance:  Using 18-gauge needle aspirated 20 cc of clear, straw-colored fluid, syringe switched and 1 cc Kenalog 40, 2 cc lidocaine, 2 cc bupivacaine injected easily Completed without difficulty  Pain immediately resolved suggesting accurate placement of the medication.  Advised to call if  fevers/chills, erythema, induration, drainage, or persistent bleeding.  Images permanently stored and available for review in the ultrasound unit.  Impression: Technically successful ultrasound guided injection.  Impression and Recommendations:    Effusion of right knee Recurrent right knee effusion. Aspiration and injection. Repeat x-rays. Return to see me in a month, MRI if not sufficiently better.   ___________________________________________ Ihor Austin. Benjamin Stain, M.D., ABFM., CAQSM. Primary Care and Sports Medicine [facility]  Adjunct Professor of Family Medicine  [employer]

## 2019-04-25 NOTE — Progress Notes (Signed)
Pt has seen results on MyChart and message also sent for patient to call back if any questions.

## 2019-05-22 ENCOUNTER — Ambulatory Visit: Payer: BLUE CROSS/BLUE SHIELD | Admitting: Sports Medicine

## 2019-06-17 DIAGNOSIS — I10 Essential (primary) hypertension: Secondary | ICD-10-CM | POA: Diagnosis not present

## 2019-06-17 DIAGNOSIS — M5441 Lumbago with sciatica, right side: Secondary | ICD-10-CM | POA: Diagnosis not present

## 2019-06-17 DIAGNOSIS — M5442 Lumbago with sciatica, left side: Secondary | ICD-10-CM | POA: Diagnosis not present

## 2020-01-31 IMAGING — DX DG KNEE 1-2V*L*
1 series · 1 of 1 positions shown · non-contrast
Comparison: None.

CLINICAL DATA: Knee effusion.

EXAM:
LEFT KNEE - 1-2 VIEW

[knee ap]
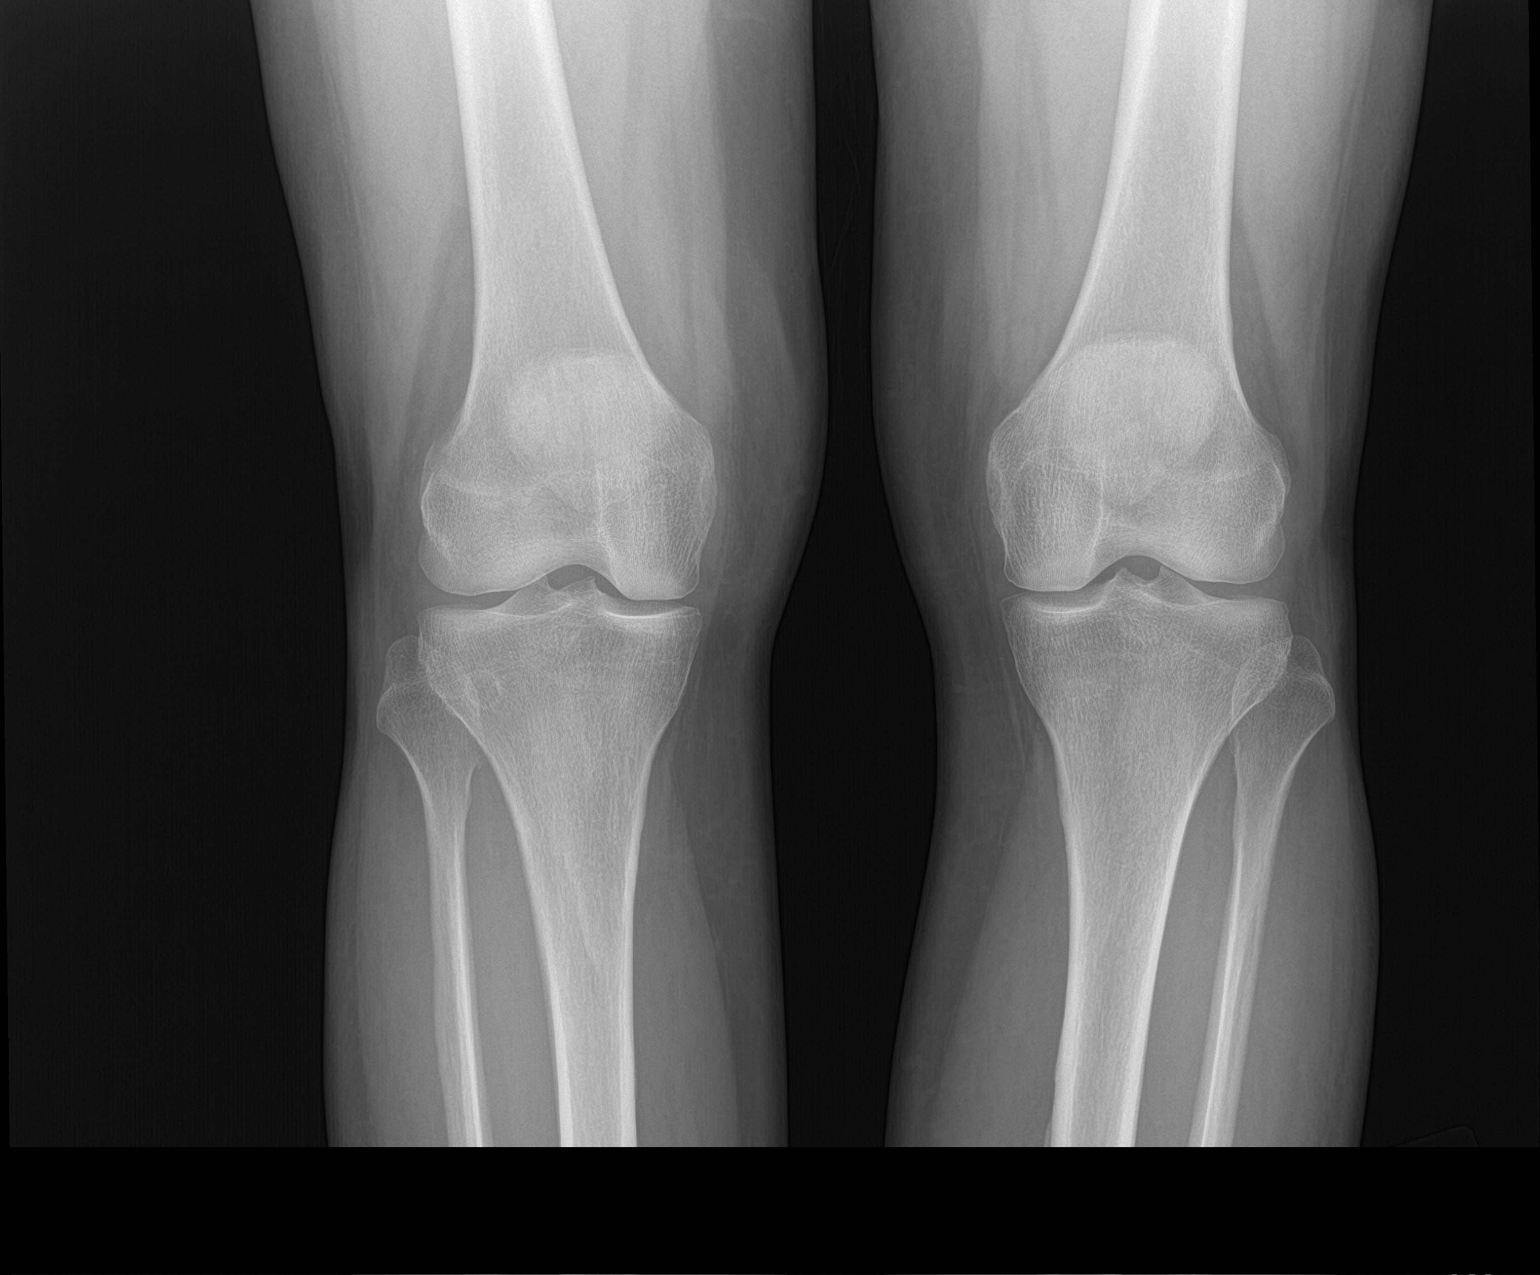

[1 of 1 positions shown; findings below may reference images not displayed]

FINDINGS: No evidence of fracture or dislocation. Minimal narrowing of medial
joint space is noted. Soft tissues are unremarkable.
IMPRESSION: Minimal degenerative joint disease is noted medially. No acute
abnormality seen the left knee.

## 2020-08-05 ENCOUNTER — Ambulatory Visit: Payer: BC Managed Care – PPO | Admitting: Sports Medicine

## 2021-01-26 DIAGNOSIS — Z79899 Other long term (current) drug therapy: Secondary | ICD-10-CM | POA: Diagnosis not present

## 2021-01-26 DIAGNOSIS — E559 Vitamin D deficiency, unspecified: Secondary | ICD-10-CM | POA: Diagnosis not present

## 2021-01-26 DIAGNOSIS — Z87898 Personal history of other specified conditions: Secondary | ICD-10-CM | POA: Diagnosis not present

## 2021-01-26 DIAGNOSIS — I10 Essential (primary) hypertension: Secondary | ICD-10-CM | POA: Diagnosis not present

## 2021-01-26 DIAGNOSIS — R739 Hyperglycemia, unspecified: Secondary | ICD-10-CM | POA: Diagnosis not present

## 2021-01-26 DIAGNOSIS — Z86711 Personal history of pulmonary embolism: Secondary | ICD-10-CM | POA: Diagnosis not present

## 2021-01-27 DIAGNOSIS — R102 Pelvic and perineal pain: Secondary | ICD-10-CM | POA: Diagnosis not present

## 2021-01-27 DIAGNOSIS — R399 Unspecified symptoms and signs involving the genitourinary system: Secondary | ICD-10-CM | POA: Diagnosis not present

## 2021-01-27 DIAGNOSIS — R319 Hematuria, unspecified: Secondary | ICD-10-CM | POA: Diagnosis not present

## 2021-01-27 DIAGNOSIS — I10 Essential (primary) hypertension: Secondary | ICD-10-CM | POA: Diagnosis not present

## 2021-03-07 DIAGNOSIS — Z885 Allergy status to narcotic agent status: Secondary | ICD-10-CM | POA: Diagnosis not present

## 2021-03-07 DIAGNOSIS — Z79899 Other long term (current) drug therapy: Secondary | ICD-10-CM | POA: Diagnosis not present

## 2021-03-07 DIAGNOSIS — R29701 NIHSS score 1: Secondary | ICD-10-CM | POA: Diagnosis not present

## 2021-03-07 DIAGNOSIS — M6281 Muscle weakness (generalized): Secondary | ICD-10-CM | POA: Diagnosis not present

## 2021-03-07 DIAGNOSIS — Z7901 Long term (current) use of anticoagulants: Secondary | ICD-10-CM | POA: Diagnosis not present

## 2021-03-07 DIAGNOSIS — Z801 Family history of malignant neoplasm of trachea, bronchus and lung: Secondary | ICD-10-CM | POA: Diagnosis not present

## 2021-03-07 DIAGNOSIS — G459 Transient cerebral ischemic attack, unspecified: Secondary | ICD-10-CM | POA: Diagnosis not present

## 2021-03-07 DIAGNOSIS — R4781 Slurred speech: Secondary | ICD-10-CM | POA: Diagnosis not present

## 2021-03-07 DIAGNOSIS — R29810 Facial weakness: Secondary | ICD-10-CM | POA: Diagnosis not present

## 2021-03-07 DIAGNOSIS — R918 Other nonspecific abnormal finding of lung field: Secondary | ICD-10-CM | POA: Diagnosis not present

## 2021-03-07 DIAGNOSIS — Z8616 Personal history of COVID-19: Secondary | ICD-10-CM | POA: Diagnosis not present

## 2021-03-07 DIAGNOSIS — R0902 Hypoxemia: Secondary | ICD-10-CM | POA: Diagnosis not present

## 2021-03-07 DIAGNOSIS — I6522 Occlusion and stenosis of left carotid artery: Secondary | ICD-10-CM | POA: Diagnosis not present

## 2021-03-07 DIAGNOSIS — R200 Anesthesia of skin: Secondary | ICD-10-CM | POA: Diagnosis not present

## 2021-03-07 DIAGNOSIS — R0602 Shortness of breath: Secondary | ICD-10-CM | POA: Diagnosis not present

## 2021-03-07 DIAGNOSIS — R531 Weakness: Secondary | ICD-10-CM | POA: Diagnosis not present

## 2021-03-07 DIAGNOSIS — Z800 Family history of malignant neoplasm of digestive organs: Secondary | ICD-10-CM | POA: Diagnosis not present

## 2021-03-07 DIAGNOSIS — J9811 Atelectasis: Secondary | ICD-10-CM | POA: Diagnosis not present

## 2021-03-07 DIAGNOSIS — Z86711 Personal history of pulmonary embolism: Secondary | ICD-10-CM | POA: Diagnosis not present

## 2021-03-07 DIAGNOSIS — Z8249 Family history of ischemic heart disease and other diseases of the circulatory system: Secondary | ICD-10-CM | POA: Diagnosis not present

## 2021-03-07 DIAGNOSIS — I639 Cerebral infarction, unspecified: Secondary | ICD-10-CM | POA: Diagnosis not present

## 2021-03-07 DIAGNOSIS — R590 Localized enlarged lymph nodes: Secondary | ICD-10-CM | POA: Diagnosis not present

## 2021-03-07 DIAGNOSIS — I10 Essential (primary) hypertension: Secondary | ICD-10-CM | POA: Diagnosis not present

## 2021-03-07 DIAGNOSIS — R000 Tachycardia, unspecified: Secondary | ICD-10-CM | POA: Diagnosis not present

## 2021-03-08 DIAGNOSIS — I080 Rheumatic disorders of both mitral and aortic valves: Secondary | ICD-10-CM | POA: Diagnosis not present

## 2021-03-08 DIAGNOSIS — I6381 Other cerebral infarction due to occlusion or stenosis of small artery: Secondary | ICD-10-CM | POA: Diagnosis not present

## 2021-03-08 DIAGNOSIS — I10 Essential (primary) hypertension: Secondary | ICD-10-CM | POA: Diagnosis not present

## 2021-03-09 DIAGNOSIS — I10 Essential (primary) hypertension: Secondary | ICD-10-CM | POA: Diagnosis not present

## 2021-03-09 DIAGNOSIS — I6381 Other cerebral infarction due to occlusion or stenosis of small artery: Secondary | ICD-10-CM | POA: Diagnosis not present

## 2021-03-17 DIAGNOSIS — Z86711 Personal history of pulmonary embolism: Secondary | ICD-10-CM | POA: Diagnosis not present

## 2021-03-17 DIAGNOSIS — Z8673 Personal history of transient ischemic attack (TIA), and cerebral infarction without residual deficits: Secondary | ICD-10-CM | POA: Diagnosis not present

## 2021-03-17 DIAGNOSIS — I10 Essential (primary) hypertension: Secondary | ICD-10-CM | POA: Diagnosis not present

## 2021-03-19 DIAGNOSIS — Z86711 Personal history of pulmonary embolism: Secondary | ICD-10-CM | POA: Diagnosis not present

## 2021-03-19 DIAGNOSIS — Z09 Encounter for follow-up examination after completed treatment for conditions other than malignant neoplasm: Secondary | ICD-10-CM | POA: Diagnosis not present

## 2021-03-19 DIAGNOSIS — I639 Cerebral infarction, unspecified: Secondary | ICD-10-CM | POA: Diagnosis not present

## 2021-04-12 DIAGNOSIS — R531 Weakness: Secondary | ICD-10-CM | POA: Diagnosis not present

## 2021-04-12 DIAGNOSIS — R5383 Other fatigue: Secondary | ICD-10-CM | POA: Diagnosis not present

## 2021-04-12 DIAGNOSIS — Z8673 Personal history of transient ischemic attack (TIA), and cerebral infarction without residual deficits: Secondary | ICD-10-CM | POA: Diagnosis not present

## 2021-04-12 DIAGNOSIS — I10 Essential (primary) hypertension: Secondary | ICD-10-CM | POA: Diagnosis not present

## 2021-05-03 DIAGNOSIS — R202 Paresthesia of skin: Secondary | ICD-10-CM | POA: Diagnosis not present

## 2021-05-03 DIAGNOSIS — F439 Reaction to severe stress, unspecified: Secondary | ICD-10-CM | POA: Diagnosis not present

## 2021-05-03 DIAGNOSIS — I10 Essential (primary) hypertension: Secondary | ICD-10-CM | POA: Diagnosis not present

## 2021-05-03 DIAGNOSIS — Z8673 Personal history of transient ischemic attack (TIA), and cerebral infarction without residual deficits: Secondary | ICD-10-CM | POA: Diagnosis not present

## 2021-05-03 DIAGNOSIS — R200 Anesthesia of skin: Secondary | ICD-10-CM | POA: Diagnosis not present

## 2021-05-14 DIAGNOSIS — R200 Anesthesia of skin: Secondary | ICD-10-CM | POA: Diagnosis not present

## 2021-05-14 DIAGNOSIS — Z8673 Personal history of transient ischemic attack (TIA), and cerebral infarction without residual deficits: Secondary | ICD-10-CM | POA: Diagnosis not present

## 2021-06-06 DIAGNOSIS — U071 COVID-19: Secondary | ICD-10-CM | POA: Diagnosis not present

## 2021-06-29 DIAGNOSIS — I10 Essential (primary) hypertension: Secondary | ICD-10-CM | POA: Diagnosis not present

## 2021-06-29 DIAGNOSIS — Z8673 Personal history of transient ischemic attack (TIA), and cerebral infarction without residual deficits: Secondary | ICD-10-CM | POA: Diagnosis not present

## 2021-06-29 DIAGNOSIS — Z86711 Personal history of pulmonary embolism: Secondary | ICD-10-CM | POA: Diagnosis not present

## 2021-08-04 DIAGNOSIS — R7303 Prediabetes: Secondary | ICD-10-CM | POA: Diagnosis not present

## 2021-08-04 DIAGNOSIS — Z79899 Other long term (current) drug therapy: Secondary | ICD-10-CM | POA: Diagnosis not present

## 2021-08-04 DIAGNOSIS — E785 Hyperlipidemia, unspecified: Secondary | ICD-10-CM | POA: Diagnosis not present

## 2021-08-04 DIAGNOSIS — E559 Vitamin D deficiency, unspecified: Secondary | ICD-10-CM | POA: Diagnosis not present

## 2021-08-04 DIAGNOSIS — Z0000 Encounter for general adult medical examination without abnormal findings: Secondary | ICD-10-CM | POA: Diagnosis not present

## 2021-08-04 DIAGNOSIS — I10 Essential (primary) hypertension: Secondary | ICD-10-CM | POA: Diagnosis not present

## 2021-08-04 DIAGNOSIS — Z8673 Personal history of transient ischemic attack (TIA), and cerebral infarction without residual deficits: Secondary | ICD-10-CM | POA: Diagnosis not present

## 2021-08-27 DIAGNOSIS — Z1231 Encounter for screening mammogram for malignant neoplasm of breast: Secondary | ICD-10-CM | POA: Diagnosis not present

## 2021-11-17 DIAGNOSIS — I10 Essential (primary) hypertension: Secondary | ICD-10-CM | POA: Diagnosis not present

## 2021-11-17 DIAGNOSIS — Z23 Encounter for immunization: Secondary | ICD-10-CM | POA: Diagnosis not present

## 2021-11-17 DIAGNOSIS — Z86711 Personal history of pulmonary embolism: Secondary | ICD-10-CM | POA: Diagnosis not present

## 2021-11-17 DIAGNOSIS — E785 Hyperlipidemia, unspecified: Secondary | ICD-10-CM | POA: Diagnosis not present

## 2021-11-17 DIAGNOSIS — Z8673 Personal history of transient ischemic attack (TIA), and cerebral infarction without residual deficits: Secondary | ICD-10-CM | POA: Diagnosis not present

## 2021-12-30 DIAGNOSIS — Z86711 Personal history of pulmonary embolism: Secondary | ICD-10-CM | POA: Diagnosis not present

## 2021-12-30 DIAGNOSIS — I10 Essential (primary) hypertension: Secondary | ICD-10-CM | POA: Diagnosis not present

## 2021-12-30 DIAGNOSIS — Z8673 Personal history of transient ischemic attack (TIA), and cerebral infarction without residual deficits: Secondary | ICD-10-CM | POA: Diagnosis not present

## 2022-03-24 DIAGNOSIS — Z8673 Personal history of transient ischemic attack (TIA), and cerebral infarction without residual deficits: Secondary | ICD-10-CM | POA: Diagnosis not present

## 2022-03-24 DIAGNOSIS — Z86711 Personal history of pulmonary embolism: Secondary | ICD-10-CM | POA: Diagnosis not present

## 2022-03-24 DIAGNOSIS — E785 Hyperlipidemia, unspecified: Secondary | ICD-10-CM | POA: Diagnosis not present

## 2022-03-24 DIAGNOSIS — I10 Essential (primary) hypertension: Secondary | ICD-10-CM | POA: Diagnosis not present

## 2022-04-08 DIAGNOSIS — Z86010 Personal history of colonic polyps: Secondary | ICD-10-CM | POA: Diagnosis not present

## 2022-04-08 DIAGNOSIS — Z1211 Encounter for screening for malignant neoplasm of colon: Secondary | ICD-10-CM | POA: Diagnosis not present

## 2022-04-08 DIAGNOSIS — K648 Other hemorrhoids: Secondary | ICD-10-CM | POA: Diagnosis not present

## 2022-04-08 DIAGNOSIS — Z09 Encounter for follow-up examination after completed treatment for conditions other than malignant neoplasm: Secondary | ICD-10-CM | POA: Diagnosis not present

## 2022-04-08 DIAGNOSIS — E7800 Pure hypercholesterolemia, unspecified: Secondary | ICD-10-CM | POA: Diagnosis not present

## 2022-08-25 DIAGNOSIS — I10 Essential (primary) hypertension: Secondary | ICD-10-CM | POA: Diagnosis not present

## 2022-08-25 DIAGNOSIS — E559 Vitamin D deficiency, unspecified: Secondary | ICD-10-CM | POA: Diagnosis not present

## 2022-08-25 DIAGNOSIS — E785 Hyperlipidemia, unspecified: Secondary | ICD-10-CM | POA: Diagnosis not present

## 2022-08-25 DIAGNOSIS — Z0000 Encounter for general adult medical examination without abnormal findings: Secondary | ICD-10-CM | POA: Diagnosis not present

## 2022-08-25 DIAGNOSIS — Z79899 Other long term (current) drug therapy: Secondary | ICD-10-CM | POA: Diagnosis not present

## 2022-08-25 DIAGNOSIS — Z8673 Personal history of transient ischemic attack (TIA), and cerebral infarction without residual deficits: Secondary | ICD-10-CM | POA: Diagnosis not present

## 2022-08-25 DIAGNOSIS — R7303 Prediabetes: Secondary | ICD-10-CM | POA: Diagnosis not present

## 2022-10-04 DIAGNOSIS — Z780 Asymptomatic menopausal state: Secondary | ICD-10-CM | POA: Diagnosis not present

## 2022-10-04 DIAGNOSIS — Z1231 Encounter for screening mammogram for malignant neoplasm of breast: Secondary | ICD-10-CM | POA: Diagnosis not present

## 2022-12-15 ENCOUNTER — Ambulatory Visit: Payer: BC Managed Care – PPO | Admitting: Sports Medicine

## 2022-12-15 ENCOUNTER — Ambulatory Visit (INDEPENDENT_AMBULATORY_CARE_PROVIDER_SITE_OTHER): Payer: BC Managed Care – PPO

## 2022-12-15 DIAGNOSIS — M7062 Trochanteric bursitis, left hip: Secondary | ICD-10-CM | POA: Diagnosis not present

## 2022-12-15 DIAGNOSIS — M25512 Pain in left shoulder: Secondary | ICD-10-CM | POA: Diagnosis not present

## 2022-12-15 DIAGNOSIS — M25511 Pain in right shoulder: Secondary | ICD-10-CM

## 2022-12-15 DIAGNOSIS — M76892 Other specified enthesopathies of left lower limb, excluding foot: Secondary | ICD-10-CM | POA: Diagnosis not present

## 2022-12-15 DIAGNOSIS — G8929 Other chronic pain: Secondary | ICD-10-CM

## 2022-12-15 DIAGNOSIS — M25552 Pain in left hip: Secondary | ICD-10-CM | POA: Diagnosis not present

## 2022-12-15 DIAGNOSIS — F4321 Adjustment disorder with depressed mood: Secondary | ICD-10-CM | POA: Diagnosis not present

## 2022-12-15 MED ORDER — MELOXICAM 15 MG PO TABS: ORAL_TABLET | ORAL | 3 refills | Status: DC

## 2022-12-15 NOTE — Assessment & Plan Note (Signed)
Increasing left lateral hip pain, suspect trochanteric bursitis, meloxicam as above, x-rays, formal physical therapy, return to see me in 6 weeks, injection if not better. She does have a 5K to do in April.

## 2022-12-15 NOTE — Assessment & Plan Note (Signed)
This is a very pleasant 68 year old female, she has a history of left rotator cuff surgery, we treated her for bilateral shoulder pain, impingement in nature back in 2018, she did well, now having recurrent pain right shoulder, she has impingement signs and symptoms on exam. An updated x-rays, meloxicam, formal physical therapy. Return to see me in 6 weeks, injection if not better.

## 2022-12-15 NOTE — Assessment & Plan Note (Signed)
Lost her father and mother within the past year. She is certainly grieving, I did offer behavioral therapy, medication, she will think about it. We did discuss the interrelationship between mood and musculoskeletal complaints.

## 2022-12-15 NOTE — Progress Notes (Signed)
    Procedures performed today:    None.  Independent interpretation of notes and tests performed by another provider:   None.  Brief History, Exam, Impression, and Recommendations:    Bilateral shoulder pain This is a very pleasant 67 year old female, she has a history of left rotator cuff surgery, we treated her for bilateral shoulder pain, impingement in nature back in 2018, she did well, now having recurrent pain right shoulder, she has impingement signs and symptoms on exam. An updated x-rays, meloxicam, formal physical therapy. Return to see me in 6 weeks, injection if not better.  Trochanteric bursitis, left hip Increasing left lateral hip pain, suspect trochanteric bursitis, meloxicam as above, x-rays, formal physical therapy, return to see me in 6 weeks, injection if not better. She does have a 5K to do in April.  Grieving Lost her father and mother within the past year. She is certainly grieving, I did offer behavioral therapy, medication, she will think about it. We did discuss the interrelationship between mood and musculoskeletal complaints.    ____________________________________________ Gwen Her. Dianah Field, M.D., ABFM., CAQSM., AME. Primary Care and Sports Medicine [facility]  Adjunct Professor of [place] [employer] [employer] Medicine  Risk manager

## 2022-12-19 ENCOUNTER — Encounter: Payer: Self-pay | Admitting: Rehabilitative and Restorative Service Providers"

## 2022-12-19 ENCOUNTER — Other Ambulatory Visit: Payer: Self-pay

## 2022-12-19 ENCOUNTER — Ambulatory Visit: Payer: BC Managed Care – PPO | Attending: Sports Medicine | Admitting: Rehabilitative and Restorative Service Providers"

## 2022-12-19 DIAGNOSIS — M6281 Muscle weakness (generalized): Secondary | ICD-10-CM | POA: Diagnosis not present

## 2022-12-19 DIAGNOSIS — M25512 Pain in left shoulder: Secondary | ICD-10-CM | POA: Insufficient documentation

## 2022-12-19 DIAGNOSIS — M25511 Pain in right shoulder: Secondary | ICD-10-CM | POA: Diagnosis not present

## 2022-12-19 DIAGNOSIS — R293 Abnormal posture: Secondary | ICD-10-CM | POA: Diagnosis not present

## 2022-12-19 DIAGNOSIS — G8929 Other chronic pain: Secondary | ICD-10-CM | POA: Diagnosis not present

## 2022-12-19 DIAGNOSIS — R29898 Other symptoms and signs involving the musculoskeletal system: Secondary | ICD-10-CM

## 2022-12-19 NOTE — Therapy (Signed)
OUTPATIENT PHYSICAL THERAPY SHOULDER EVALUATION   Patient Name: Kathleen Kennedy MRN: [id number] DOB:05/23/1956, 67 y.o., female Today's Date: 12/19/2022  END OF SESSION:  PT End of Session - 12/19/22 0929     Visit Number 1    Number of Visits 16    Date for PT Re-Evaluation 02/13/23    Authorization Type BCBS    PT Start Time 0800    PT Stop Time 0845    PT Time Calculation (min) 45 min    Activity Tolerance Patient tolerated treatment well             Past Medical History:  Diagnosis Date   Hypertension    Past Surgical History:  Procedure Laterality Date   ROTATOR CUFF REPAIR  APRIL 2004   Patient Active Problem List   Diagnosis Date Noted   Grieving 12/15/2022   Trochanteric bursitis, left hip 07/20/2018   Effusion of right knee 07/03/2018   Bilateral shoulder pain 09/04/2017   Bilateral wrist pain 09/04/2017   Colon cancer screening 01/13/2016   Breast cancer screening 01/13/2016   Peroneal tendinitis of lower leg 12/30/2015   Seborrheic keratosis 12/30/2015   Elevated blood pressure 12/30/2015   Family history of colon cancer 01/28/2013   Avitaminosis D 01/28/2013   Colon polyp 01/09/2012    PCP: Arliss Journey, PA-C  REFERRING PROVIDER: Dr Rodney Langton  REFERRING DIAG: Chronic bilat shoulder pain   THERAPY DIAG:  Chronic right shoulder pain - Plan: PT plan of care cert/re-cert  Chronic left shoulder pain - Plan: PT plan of care cert/re-cert  Abnormal posture - Plan: PT plan of care cert/re-cert  Other symptoms and signs involving the musculoskeletal system - Plan: PT plan of care cert/re-cert  Muscle weakness (generalized) - Plan: PT plan of care cert/re-cert  Rationale for Evaluation and Treatment: Rehabilitation  ONSET DATE: 04/28/22  SUBJECTIVE:                                                                                                                                                                                       SUBJECTIVE STATEMENT: Patient reports bilat shoulder pain over the past couple of years with symptoms increasing in the past 6 months. She does not know of any accident or injury. She has difficulty sleeping on her side or lifting arm to the side or in different ways. Rt UE is worse than the Lt. Had Lt RC surgery in 2004 but she never returned to full function of Lt UE but she can use the Lt UE for activities.   PERTINENT HISTORY: Lt RCR 2004; covid 11/21; HTN; blood clots and pneumonia both lungs 11/21; stroke 4/22  PAIN:  Are you having pain? Yes: NPRS scale: 7/10 Pain location: bilat shoulders  Pain description: sharp - center of shoulder  Aggravating factors: with movement Relieving factors: with rest  PRECAUTIONS: None  WEIGHT BEARING RESTRICTIONS: No  FALLS:  Has patient fallen in last 6 months? No  LIVING ENVIRONMENT: Lives with: lives with their spouse Lives in: House/apartment   OCCUPATION: Work FT in [employer] - sitting or standing; walking; arms forward at work; lifting up to 30 pounds - physical job. Has worked with current employer for 17 yrs similar work 15 yrs prior; household chores. Signed up for 5 K in April and wants to get back into running   PLOF: Independent  PATIENT GOALS: get rid of pain; help get back to being more active and more physical   NEXT MD VISIT: to schedule after 6 weeks of PT   OBJECTIVE:   DIAGNOSTIC FINDINGS:  Rt shoulder xray - 12/15/22 - negative  Lt hip xray - 12/15/22 - Enthesopathic changes   PATIENT SURVEYS:  FOTO 37; goal 66  COGNITION: Overall cognitive status: Within functional limits for tasks assessed     SENSATION: WFL  POSTURE: Patient presents with head forward posture with increased thoracic kyphosis; shoulders rounded and elevated; scapulae abducted and rotated along the thoracic spine; head of the humerus anterior in orientation.   UPPER EXTREMITY ROM:   Active ROM Right eval Left eval   Shoulder flexion 39 pain; tight 137 tight  Shoulder extension 67 tight ant shd  70 elbow tightness   Shoulder abduction 119 tight; pain 154  Shoulder adduction    Shoulder internal rotation T5 T5 tight  Shoulder external rotation 80 tight pain 90  Elbow flexion    Elbow extension    Wrist flexion    Wrist extension    Wrist ulnar deviation    Wrist radial deviation    Wrist pronation    Wrist supination    (Blank rows = not tested) Cervical ROM tight end ranges rotation and lateral flexion  UPPER EXTREMITY MMT:  MMT Right eval Left eval  Shoulder flexion 4 4+  Shoulder extension 4+ 4+  Shoulder abduction 4- 4  Shoulder adduction    Shoulder internal rotation 4+ 5-  Shoulder external rotation 4 4+  Middle trapezius 4 4  Lower trapezius 4 4  Elbow flexion    Elbow extension    Wrist flexion    Wrist extension    Wrist ulnar deviation    Wrist radial deviation    Wrist pronation    Wrist supination    Grip strength (lbs)    (Blank rows = not tested)  SHOULDER SPECIAL TESTS: Tight bilat pecs; biceps   PALPATION:  Significant muscular tightness Rt > Lt pecs; upper traps; ant/lat/post cervical musculature; thoracic paraspinals; teres; biceps    TODAY'S TREATMENT:  [facility] Adult PT Treatment:                                                DATE: 12/19/22 Therapeutic Exercise: Chin tuck with noodle 5 sec x 5 Scap squeeze with noodle 5 sec x 10  L's with noodle x 10  W's with noodle x 10  Doorway stretch 3 positions 30 sec x 1  Manual Therapy: Add  Neuromuscular re-ed: Initiated postural correction and education engaging posterior shoulder girdle  Therapeutic Activity: Sitting with noodle along spine  Modalities: Possible trial of TENS for pain management  Self Care: Myofacial ball release work Rt/Lt shoulder girdle standing      PATIENT EDUCATION: Education details: POC; HEP  Person educated: Patient Education method: Programmer, multimedia, Facilities manager, Actor cues, Verbal cues, and Handouts Education comprehension: verbalized understanding, returned demonstration, verbal cues required, tactile cues required, and needs further education  HOME EXERCISE PROGRAM: Access Code: [email or web address] URL: [email or web address] Date: 12/19/2022 Prepared by: Corlis Leak  Exercises - Seated Cervical Retraction  - 3 x daily - 7 x weekly - 1 sets - 10 reps - Standing Scapular Retraction  - 3 x daily - 7 x weekly - 1 sets - 10 reps - 10 hold - Shoulder External Rotation and Scapular Retraction  - 3 x daily - 7 x weekly - 1 sets - 10 reps -   hold - Seated Shoulder W  - 2 x daily - 7 x weekly - 1 sets - 10 reps - 3 sec  hold - Doorway Pec Stretch at 60 Degrees Abduction  - 3 x daily - 7 x weekly - 1 sets - 3 reps - Doorway Pec Stretch at 90 Degrees Abduction  - 3 x daily - 7 x weekly - 1 sets - 3 reps - 30 seconds  hold - Doorway Pec Stretch at 120 Degrees Abduction  - 3 x daily - 7 x weekly - 1 sets - 3 reps - 30 second hold  hold - Standing Pectoral Release with Ball at Wall  - 3-4 x daily - 7 x weekly  Patient Education - TENS Unit - Trigger Point Dry Needling  ASSESSMENT:  CLINICAL IMPRESSION: Patient is a 67 y.o. female who was seen today for physical therapy evaluation and treatment for bilat shoulder pain with symptoms present over the past 2 years, increasing in the past 6 months with no known injury. She presents with poor posture and alignment; limited cervical and Rt > Lt shoulder ROM; UE/postural weakness; pain with resistive strength testing and functional activities; difficulty sleeping; decreased functional activities Rt > Lt UE's. Patient will benefit from P to address problems identified.    OBJECTIVE IMPAIRMENTS: decreased activity tolerance, decreased mobility, decreased ROM, decreased strength,  hypomobility, increased fascial restrictions, impaired flexibility, impaired UE functional use, improper body mechanics, postural dysfunction, and pain.   ACTIVITY LIMITATIONS: carrying, lifting, sleeping, and reach over head  PARTICIPATION LIMITATIONS: cleaning, laundry, occupation, and yard work  PERSONAL FACTORS: Behavior pattern, Fitness, Past/current experiences, Profession, Time since onset of injury/illness/exacerbation, and comorbidities: abnormal posture and alignment; hx of stroke are also affecting patient's functional outcome.   REHAB POTENTIAL: Good  CLINICAL DECISION MAKING: Stable/uncomplicated  EVALUATION COMPLEXITY: Low   GOALS: Goals reviewed with patient? Yes  SHORT TERM GOALS: Target date: 01/16/2023  Independent in initial HEP  Baseline: Goal status: INITIAL  2.  Patient to demonstrate and reports improving posture and alignment with functional activities  Baseline:  Goal status: INITIAL  LONG TERM GOALS: Target date: 02/13/2023   Decrease Rt/Lt shoulder pain by 50-75% allowing patient to use UE's for more functional activities with less pain  Baseline:  Goal status: INITIAL  2.  Increase AROM Rt shoulder to =/> than AROM Lt shoulder with no pain with AROM Baseline:  Goal status: INITIAL  3.  Increase strength bilat shoulders to 4+/5 to 5/5  Baseline:  Goal status: INITIAL  4.  Improve posture and alignment with patient to demonstrate improved upright posture with posterior shoulder girdle musculature engaged  Baseline:  Goal status: INITIAL  5.  Independent in HEP including aquatic program as indicated  Baseline:  Goal status: INITIAL  6.  Improve functional limitation score to 66 Baseline: 37 Goal status: INITIAL  PLAN:  PT FREQUENCY: 2x/week  PT DURATION: 8 weeks  PLANNED INTERVENTIONS: Therapeutic exercises, Therapeutic activity, Neuromuscular re-education, Patient/Family education, Self Care, Joint mobilization, Aquatic Therapy, Dry  Needling, Electrical stimulation, Cryotherapy, Moist heat, Ultrasound, Ionotophoresis 4mg /ml Dexamethasone, Manual therapy, and Re-evaluation  PLAN FOR NEXT SESSION: review and progress HEP; continued postural correction and education; manual work, DN, modalities as indicated    [employer], PT 12/19/2022, 10:42 AM

## 2022-12-21 ENCOUNTER — Ambulatory Visit: Payer: BC Managed Care – PPO | Admitting: Rehabilitative and Restorative Service Providers"

## 2022-12-21 ENCOUNTER — Encounter: Payer: Self-pay | Admitting: Rehabilitative and Restorative Service Providers"

## 2022-12-21 DIAGNOSIS — M25511 Pain in right shoulder: Secondary | ICD-10-CM | POA: Diagnosis not present

## 2022-12-21 DIAGNOSIS — M25512 Pain in left shoulder: Secondary | ICD-10-CM | POA: Diagnosis not present

## 2022-12-21 DIAGNOSIS — R293 Abnormal posture: Secondary | ICD-10-CM | POA: Diagnosis not present

## 2022-12-21 DIAGNOSIS — G8929 Other chronic pain: Secondary | ICD-10-CM | POA: Diagnosis not present

## 2022-12-21 DIAGNOSIS — M6281 Muscle weakness (generalized): Secondary | ICD-10-CM

## 2022-12-21 DIAGNOSIS — R29898 Other symptoms and signs involving the musculoskeletal system: Secondary | ICD-10-CM

## 2022-12-21 NOTE — Therapy (Signed)
OUTPATIENT PHYSICAL THERAPY SHOULDER TREATMENT   Patient Name: Kathleen Kennedy MRN: [id number] DOB:12-Jul-1956, 67 y.o., female Today's Date: 12/21/2022  END OF SESSION:  PT End of Session - 12/21/22 0858     Visit Number 2    Number of Visits 16    Date for PT Re-Evaluation 02/13/23    Authorization Type BCBS    PT Start Time 0856    PT Stop Time 0945    PT Time Calculation (min) 49 min    Activity Tolerance Patient tolerated treatment well             Past Medical History:  Diagnosis Date   Hypertension    Past Surgical History:  Procedure Laterality Date   ROTATOR CUFF REPAIR  APRIL 2004   Patient Active Problem List   Diagnosis Date Noted   Grieving 12/15/2022   Trochanteric bursitis, left hip 07/20/2018   Effusion of right knee 07/03/2018   Bilateral shoulder pain 09/04/2017   Bilateral wrist pain 09/04/2017   Colon cancer screening 01/13/2016   Breast cancer screening 01/13/2016   Peroneal tendinitis of lower leg 12/30/2015   Seborrheic keratosis 12/30/2015   Elevated blood pressure 12/30/2015   Family history of colon cancer 01/28/2013   Avitaminosis D 01/28/2013   Colon polyp 01/09/2012    PCP: Teodoro Kil, PA-C  REFERRING PROVIDER: Dr Aundria Mems  REFERRING DIAG: Chronic bilat shoulder pain   THERAPY DIAG:  Chronic right shoulder pain  Chronic left shoulder pain  Abnormal posture  Other symptoms and signs involving the musculoskeletal system  Muscle weakness (generalized)  Rationale for Evaluation and Treatment: Rehabilitation  ONSET DATE: 04/28/22  SUBJECTIVE:                                                                                                                                                                                      SUBJECTIVE STATEMENT:  Has worked on her exercises at home. Notices some soreness in the ribs. Interested in TENS unit for home.     PERTINENT HISTORY: Patient reports bilat shoulder pain  over the past couple of years with symptoms increasing in the past 6 months. She does not know of any accident or injury. She has difficulty sleeping on her side or lifting arm to the side or in different ways. Rt UE is worse than the Lt. Had Lt RC surgery in 2004 but she never returned to full function of Lt UE but she can use the Lt UE for activities.Lt RCR 2004; covid 11/21; HTN; blood clots and pneumonia both lungs 11/21; stroke 4/22  PAIN:  Are you having pain? Yes: NPRS scale: 8-9/10; 0/10 at rest  Pain location: bilat shoulders  Pain description: sharp - center of shoulder  Aggravating factors: with movement Relieving factors: with rest  PRECAUTIONS: None  WEIGHT BEARING RESTRICTIONS: No  FALLS:  Has patient fallen in last 6 months? No  LIVING ENVIRONMENT: Lives with: lives with their spouse Lives in: House/apartment   OCCUPATION: Work FT in [employer] - sitting or standing; walking; arms forward at work; lifting up to 30 pounds - physical job. Has worked with current employer for 17 yrs similar work 15 yrs prior; household chores. Signed up for 5 K in April and wants to get back into running   PLOF: Independent  PATIENT GOALS: get rid of pain; help get back to being more active and more physical   NEXT MD VISIT: to schedule after 6 weeks of PT   OBJECTIVE:   DIAGNOSTIC FINDINGS:  Rt shoulder xray - 12/15/22 - negative  Lt hip xray - 12/15/22 - Enthesopathic changes   PATIENT SURVEYS:  FOTO 37; goal 66  POSTURE: Patient presents with head forward posture with increased thoracic kyphosis; shoulders rounded and elevated; scapulae abducted and rotated along the thoracic spine; head of the humerus anterior in orientation.   UPPER EXTREMITY ROM:   Active ROM Right eval Left eval  Shoulder flexion 39 pain; tight 137 tight  Shoulder extension 67 tight ant shd  70 elbow tightness   Shoulder abduction 119 tight; pain 154  Shoulder adduction    Shoulder internal  rotation T5 T5 tight  Shoulder external rotation 80 tight pain 90  Elbow flexion    Elbow extension    Wrist flexion    Wrist extension    Wrist ulnar deviation    Wrist radial deviation    Wrist pronation    Wrist supination    (Blank rows = not tested) Cervical ROM tight end ranges rotation and lateral flexion  UPPER EXTREMITY MMT:  MMT Right eval Left eval  Shoulder flexion 4 4+  Shoulder extension 4+ 4+  Shoulder abduction 4- 4  Shoulder adduction    Shoulder internal rotation 4+ 5-  Shoulder external rotation 4 4+  Middle trapezius 4 4  Lower trapezius 4 4  Elbow flexion    Elbow extension    Wrist flexion    Wrist extension    Wrist ulnar deviation    Wrist radial deviation    Wrist pronation    Wrist supination    Grip strength (lbs)    (Blank rows = not tested)  SHOULDER SPECIAL TESTS: Tight bilat pecs; biceps   PALPATION:  Significant muscular tightness Rt > Lt pecs; upper traps; ant/lat/post cervical musculature; thoracic paraspinals; teres; biceps    TODAY'S TREATMENT:                                                                                                                                         [facility] Adult PT  Treatment:                                                 DATE: 12/21/22 Therapeutic Exercise: Chin tuck with noodle 5 sec x 5 Scap squeeze with noodle 5 sec x 10  L's with noodle x 10  W's with noodle x 10  Doorway stretch 3 positions 30 sec x 1  Prolonged snow angel arms ~ 80 deg abd x 2 min  Manual Therapy: Add  Neuromuscular re-ed: Initiated postural correction and education engaging posterior shoulder girdle  Therapeutic Activity: Sitting with noodle along spine  Modalities: TENS bilat shoulders x 10 min  Moist heat cervical and shoulders x 10 min   Self Care: Myofacial ball release work Rt/Lt shoulder girdle standing   DATE: 12/19/22 Therapeutic Exercise: Chin tuck with noodle 5 sec x 5 Scap squeeze with noodle 5 sec x 10   L's with noodle x 10  W's with noodle x 10  Doorway stretch 3 positions 30 sec x 1  Manual Therapy: Add  Neuromuscular re-ed: Initiated postural correction and education engaging posterior shoulder girdle  Therapeutic Activity: Sitting with noodle along spine  Modalities: Possible trial of TENS for pain management  Self Care: Myofacial ball release work Rt/Lt shoulder girdle standing     PATIENT EDUCATION: Education details: POC; HEP  Person educated: Patient Education method: Programmer, multimedia, Facilities manager, Actor cues, Verbal cues, and Handouts Education comprehension: verbalized understanding, returned demonstration, verbal cues required, tactile cues required, and needs further education  HOME EXERCISE PROGRAM: Access Code: [email or web address] URL: [email or web address] Date: 12/21/2022 Prepared by: Corlis Leak  Exercises - Seated Cervical Retraction  - 3 x daily - 7 x weekly - 1 sets - 10 reps - Standing Scapular Retraction  - 3 x daily - 7 x weekly - 1 sets - 10 reps - 10 hold - Shoulder External Rotation and Scapular Retraction  - 3 x daily - 7 x weekly - 1 sets - 10 reps -   hold - Seated Shoulder W  - 2 x daily - 7 x weekly - 1 sets - 10 reps - 3 sec  hold - Doorway Pec Stretch at 60 Degrees Abduction  - 3 x daily - 7 x weekly - 1 sets - 3 reps - Doorway Pec Stretch at 90 Degrees Abduction  - 3 x daily - 7 x weekly - 1 sets - 3 reps - 30 seconds  hold - Doorway Pec Stretch at 120 Degrees Abduction  - 3 x daily - 7 x weekly - 1 sets - 3 reps - 30 second hold  hold - Standing Pectoral Release with Ball at Wall  - 3-4 x daily - 7 x weekly - Supine Chest Stretch on Foam Roll  - 2 x daily - 7 x weekly - 1 sets - 1 reps - 2-5 min  sec  hold  Patient Education - TENS Unit - Trigger Point Dry Needling  ASSESSMENT:  CLINICAL IMPRESSION: Patient is working on exercises at home. She has continued Rt > Lt shoulder pain with movement but no pain when at rest. Reviewed  exercises and added prolonged snow angel to open chest. Trial of manual work and TENS to address tightness. Will progress with thoracic extension and shoulder ROM as well as trial of DN as patient decides.   OBJECTIVE IMPAIRMENTS: decreased activity tolerance,  decreased mobility, decreased ROM, decreased strength, hypomobility, increased fascial restrictions, impaired flexibility, impaired UE functional use, improper body mechanics, postural dysfunction, and pain.   GOALS: Goals reviewed with patient? Yes  SHORT TERM GOALS: Target date: 01/16/2023  Independent in initial HEP  Baseline: Goal status: INITIAL  2.  Patient to demonstrate and reports improving posture and alignment with functional activities  Baseline:  Goal status: INITIAL  LONG TERM GOALS: Target date: 02/13/2023   Decrease Rt/Lt shoulder pain by 50-75% allowing patient to use UE's for more functional activities with less pain  Baseline:  Goal status: INITIAL  2.  Increase AROM Rt shoulder to =/> than AROM Lt shoulder with no pain with AROM Baseline:  Goal status: INITIAL  3.  Increase strength bilat shoulders to 4+/5 to 5/5  Baseline:  Goal status: INITIAL  4.  Improve posture and alignment with patient to demonstrate improved upright posture with posterior shoulder girdle musculature engaged  Baseline:  Goal status: INITIAL  5.  Independent in HEP including aquatic program as indicated  Baseline:  Goal status: INITIAL  6.  Improve functional limitation score to 66 Baseline: 37 Goal status: INITIAL  PLAN:  PT FREQUENCY: 2x/week  PT DURATION: 8 weeks  PLANNED INTERVENTIONS: Therapeutic exercises, Therapeutic activity, Neuromuscular re-education, Patient/Family education, Self Care, Joint mobilization, Aquatic Therapy, Dry Needling, Electrical stimulation, Cryotherapy, Moist heat, Ultrasound, Ionotophoresis 4mg /ml Dexamethasone, Manual therapy, and Re-evaluation  PLAN FOR NEXT SESSION: review and  progress HEP; continued postural correction and education; manual work, DN, modalities as indicated    [employer], PT 12/21/2022, 8:59 AM

## 2022-12-26 ENCOUNTER — Encounter: Payer: Self-pay | Admitting: Rehabilitative and Restorative Service Providers"

## 2022-12-26 ENCOUNTER — Ambulatory Visit: Payer: BC Managed Care – PPO | Admitting: Rehabilitative and Restorative Service Providers"

## 2022-12-26 DIAGNOSIS — M25512 Pain in left shoulder: Secondary | ICD-10-CM | POA: Diagnosis not present

## 2022-12-26 DIAGNOSIS — R293 Abnormal posture: Secondary | ICD-10-CM

## 2022-12-26 DIAGNOSIS — G8929 Other chronic pain: Secondary | ICD-10-CM

## 2022-12-26 DIAGNOSIS — R29898 Other symptoms and signs involving the musculoskeletal system: Secondary | ICD-10-CM

## 2022-12-26 DIAGNOSIS — M6281 Muscle weakness (generalized): Secondary | ICD-10-CM | POA: Diagnosis not present

## 2022-12-26 DIAGNOSIS — M25511 Pain in right shoulder: Secondary | ICD-10-CM | POA: Diagnosis not present

## 2022-12-26 NOTE — Therapy (Signed)
OUTPATIENT PHYSICAL THERAPY SHOULDER TREATMENT   Patient Name: Kathleen Kennedy MRN: [id number] DOB:[birth year], 67 y.o., female Today's Date: 12/26/2022  END OF SESSION:  PT End of Session - 12/26/22 0805     Visit Number 3    Number of Visits 16    Date for PT Re-Evaluation 02/13/23    Authorization Type BCBS    PT Start Time 0800    PT Stop Time 0848    PT Time Calculation (min) 48 min    Activity Tolerance Patient tolerated treatment well             Past Medical History:  Diagnosis Date   Hypertension    Past Surgical History:  Procedure Laterality Date   ROTATOR CUFF REPAIR  APRIL 2004   Patient Active Problem List   Diagnosis Date Noted   Grieving 12/15/2022   Trochanteric bursitis, left hip 07/20/2018   Effusion of right knee 07/03/2018   Bilateral shoulder pain 09/04/2017   Bilateral wrist pain 09/04/2017   Colon cancer screening 01/13/2016   Breast cancer screening 01/13/2016   Peroneal tendinitis of lower leg 12/30/2015   Seborrheic keratosis 12/30/2015   Elevated blood pressure 12/30/2015   Family history of colon cancer 01/28/2013   Avitaminosis D 01/28/2013   Colon polyp 01/09/2012    PCP: Teodoro Kil, PA-C  REFERRING PROVIDER: Dr Aundria Mems  REFERRING DIAG: Chronic bilat shoulder pain   THERAPY DIAG:  Chronic right shoulder pain  Chronic left shoulder pain  Abnormal posture  Other symptoms and signs involving the musculoskeletal system  Muscle weakness (generalized)  Rationale for Evaluation and Treatment: Rehabilitation  ONSET DATE: 04/28/22  SUBJECTIVE:                                                                                                                                                                                      SUBJECTIVE STATEMENT:  Has worked on her exercises at home. Notices increased pain and soreness in the Rt shoulder and ribs. Worse over the weekend. She had a more demanding day at work Friday  - reaching forward and lifting in forward position. Interested in TENS unit for home.     PERTINENT HISTORY: Patient reports bilat shoulder pain over the past couple of years with symptoms increasing in the past 6 months. She does not know of any accident or injury. She has difficulty sleeping on her side or lifting arm to the side or in different ways. Rt UE is worse than the Lt. Had Lt RC surgery in 2004 but she never returned to full function of Lt UE but she can use the Lt UE for activities.Lt RCR 2004; covid  11/21; HTN; blood clots and pneumonia both lungs 11/21; stroke 4/22  PAIN:  Are you having pain? Yes: NPRS scale: 9.5/10; 0/10 at rest  Pain location: Rt shoulder  Pain description: sharp - center of shoulder  Aggravating factors: with movement Relieving factors: with rest  PRECAUTIONS: None  WEIGHT BEARING RESTRICTIONS: No  FALLS:  Has patient fallen in last 6 months? No  LIVING ENVIRONMENT: Lives with: lives with their spouse Lives in: House/apartment   OCCUPATION: Work FT in [employer] - sitting or standing; walking; arms forward at work; lifting up to 30 pounds - physical job. Has worked with current employer for 17 yrs similar work 15 yrs prior; household chores. Signed up for 5 K in April and wants to get back into running   PLOF: Independent  PATIENT GOALS: get rid of pain; help get back to being more active and more physical   NEXT MD VISIT: to schedule after 6 weeks of PT   OBJECTIVE:   DIAGNOSTIC FINDINGS:  Rt shoulder xray - 12/15/22 - negative  Lt hip xray - 12/15/22 - Enthesopathic changes   PATIENT SURVEYS:  FOTO 37; goal 66  POSTURE: Patient presents with head forward posture with increased thoracic kyphosis; shoulders rounded and elevated; scapulae abducted and rotated along the thoracic spine; head of the humerus anterior in orientation.   UPPER EXTREMITY ROM:   Active ROM Right eval Left eval  Shoulder flexion 39 pain; tight 137  tight  Shoulder extension 67 tight ant shd  70 elbow tightness   Shoulder abduction 119 tight; pain 154  Shoulder adduction    Shoulder internal rotation T5 T5 tight  Shoulder external rotation 80 tight pain 90  Elbow flexion    Elbow extension    Wrist flexion    Wrist extension    Wrist ulnar deviation    Wrist radial deviation    Wrist pronation    Wrist supination    (Blank rows = not tested) Cervical ROM tight end ranges rotation and lateral flexion  UPPER EXTREMITY MMT:  MMT Right eval Left eval  Shoulder flexion 4 4+  Shoulder extension 4+ 4+  Shoulder abduction 4- 4  Shoulder adduction    Shoulder internal rotation 4+ 5-  Shoulder external rotation 4 4+  Middle trapezius 4 4  Lower trapezius 4 4  Elbow flexion    Elbow extension    Wrist flexion    Wrist extension    Wrist ulnar deviation    Wrist radial deviation    Wrist pronation    Wrist supination    Grip strength (lbs)    (Blank rows = not tested)  SHOULDER SPECIAL TESTS: Tight bilat pecs; biceps   PALPATION:  Significant muscular tightness Rt > Lt pecs; upper traps; ant/lat/post cervical musculature; thoracic paraspinals; teres; biceps    TODAY'S TREATMENT:  [facility] Adult PT Treatment:                                                 DATE: 12/26/22 Therapeutic Exercise: Chin tuck with noodle 5 sec x 5 Scap squeeze with noodle 5 sec x 10  L's with noodle x 10  W's with noodle x 10  Doorway stretch 3 positions 30 sec x 1  Prolonged snow angel arms ~ 80 deg abd x 2 min  Shoulder flexion table slide standing 5 sec x 5  Backward shoulder rolls  Manual Therapy: Skilled palpation to assess response to manual work and DN; STM Rt anterior shoulder/arm and upper trap Trigger Point Dry-Needling  Treatment instructions: Expect mild to moderate muscle soreness. S/S of  pneumothorax if dry needled over a lung field, and to seek immediate medical attention should they occur. Patient verbalized understanding of these instructions and education.  Patient Consent Given: Yes Education handout provided: Previously provided Muscles treated: Rt pecs; biceps; upper trap  Electrical stimulation performed: No Parameters: N/A Treatment response/outcome: decreased palpable tightness noted in all muscle groups treated  Neuromuscular re-ed: Continued postural correction and education engaging posterior shoulder girdle  Therapeutic Activity: Prolonged snow angel for home Modalities: TENS bilat shoulders x 10 min  Moist heat cervical and shoulders x 10 min   Self Care: Myofacial ball release work Rt/Lt shoulder girdle standing  To avoid forward reach and lift activities    DATE: 12/21/22 Therapeutic Exercise: Chin tuck with noodle 5 sec x 5 Scap squeeze with noodle 5 sec x 10  L's with noodle x 10  W's with noodle x 10  Doorway stretch 3 positions 30 sec x 1  Prolonged snow angel arms ~ 80 deg abd x 2 min  Manual Therapy: Add  Neuromuscular re-ed: Initiated postural correction and education engaging posterior shoulder girdle  Therapeutic Activity: Sitting with noodle along spine  Modalities: TENS bilat shoulders x 10 min  Moist heat cervical and shoulders x 10 min   Self Care: Myofacial ball release work Rt/Lt shoulder girdle standing       PATIENT EDUCATION: Education details: POC; HEP  Person educated: Patient Education method: Consulting civil engineer, Media planner, Corporate treasurer cues, Verbal cues, and Handouts Education comprehension: verbalized understanding, returned demonstration, verbal cues required, tactile cues required, and needs further education  HOME EXERCISE PROGRAM: Access Code: [email or web address] URL: [email or web address] Date: 12/26/2022 Prepared by: Gillermo Murdoch  Exercises - Seated Cervical Retraction  - 3 x daily - 7 x weekly - 1 sets - 10  reps - Standing Scapular Retraction  - 3 x daily - 7 x weekly - 1 sets - 10 reps - 10 hold - Shoulder External Rotation and Scapular Retraction  - 3 x daily - 7 x weekly - 1 sets - 10 reps -   hold - Seated Shoulder W  - 2 x daily - 7 x weekly - 1 sets - 10 reps - 3 sec  hold - Doorway Pec Stretch at 60 Degrees Abduction  - 3 x daily - 7 x weekly - 1 sets - 3 reps - Doorway Pec Stretch at 90 Degrees Abduction  - 3 x daily - 7 x weekly - 1 sets - 3 reps - 30 seconds  hold - Doorway Pec Stretch at 120 Degrees Abduction  - 3 x daily - 7 x weekly - 1  sets - 3 reps - 30 second hold  hold - Standing Pectoral Release with Ball at Wall  - 3-4 x daily - 7 x weekly - Supine Chest Stretch on Foam Roll  - 2 x daily - 7 x weekly - 1 sets - 1 reps - 2-5 min  sec  hold - Seated Bilateral Shoulder Flexion Towel Slide at Table Top  - 2-3 x daily - 7 x weekly - 1 sets - 3-5 reps - 5-10  sec  hold  Patient Education - TENS Unit - Trigger Point Dry Needling  ASSESSMENT:  CLINICAL IMPRESSION: Persistent pain which was increased over the weekend. Patient feels she irritated symptoms with new activities at work Friday in which she had to reach forward with Rt UE and lift in a forward position. She is working on exercises at home. She has continued Rt > Lt shoulder pain with movement and activity but no pain when at rest. Reviewed exercises and added AAROM shoulder flexion with sliding UE fwd on table. Trial of manual work and dry needling to address muscular tightness an pain. TENS to address tightness. Will progress with thoracic extension and shoulder ROM.  OBJECTIVE IMPAIRMENTS: decreased activity tolerance, decreased mobility, decreased ROM, decreased strength, hypomobility, increased fascial restrictions, impaired flexibility, impaired UE functional use, improper body mechanics, postural dysfunction, and pain.   GOALS: Goals reviewed with patient? Yes  SHORT TERM GOALS: Target date: 01/16/2023  Independent  in initial HEP  Baseline: Goal status: INITIAL  2.  Patient to demonstrate and reports improving posture and alignment with functional activities  Baseline:  Goal status: INITIAL  LONG TERM GOALS: Target date: 02/13/2023   Decrease Rt/Lt shoulder pain by 50-75% allowing patient to use UE's for more functional activities with less pain  Baseline:  Goal status: INITIAL  2.  Increase AROM Rt shoulder to =/> than AROM Lt shoulder with no pain with AROM Baseline:  Goal status: INITIAL  3.  Increase strength bilat shoulders to 4+/5 to 5/5  Baseline:  Goal status: INITIAL  4.  Improve posture and alignment with patient to demonstrate improved upright posture with posterior shoulder girdle musculature engaged  Baseline:  Goal status: INITIAL  5.  Independent in HEP including aquatic program as indicated  Baseline:  Goal status: INITIAL  6.  Improve functional limitation score to 66 Baseline: 37 Goal status: INITIAL  PLAN:  PT FREQUENCY: 2x/week  PT DURATION: 8 weeks  PLANNED INTERVENTIONS: Therapeutic exercises, Therapeutic activity, Neuromuscular re-education, Patient/Family education, Self Care, Joint mobilization, Aquatic Therapy, Dry Needling, Electrical stimulation, Cryotherapy, Moist heat, Ultrasound, Ionotophoresis 4mg /ml Dexamethasone, Manual therapy, and Re-evaluation  PLAN FOR NEXT SESSION: review and progress HEP; continued postural correction and education; manual work, DN, modalities as indicated    , PT 12/26/2022, 8:46 AM

## 2022-12-29 ENCOUNTER — Ambulatory Visit: Payer: BC Managed Care – PPO | Attending: Sports Medicine | Admitting: Rehabilitative and Restorative Service Providers"

## 2022-12-29 ENCOUNTER — Encounter: Payer: Self-pay | Admitting: Rehabilitative and Restorative Service Providers"

## 2022-12-29 DIAGNOSIS — G8929 Other chronic pain: Secondary | ICD-10-CM | POA: Insufficient documentation

## 2022-12-29 DIAGNOSIS — R29898 Other symptoms and signs involving the musculoskeletal system: Secondary | ICD-10-CM | POA: Diagnosis present

## 2022-12-29 DIAGNOSIS — M6281 Muscle weakness (generalized): Secondary | ICD-10-CM | POA: Insufficient documentation

## 2022-12-29 DIAGNOSIS — M25512 Pain in left shoulder: Secondary | ICD-10-CM | POA: Insufficient documentation

## 2022-12-29 DIAGNOSIS — R293 Abnormal posture: Secondary | ICD-10-CM | POA: Insufficient documentation

## 2022-12-29 DIAGNOSIS — M25511 Pain in right shoulder: Secondary | ICD-10-CM | POA: Diagnosis not present

## 2022-12-29 NOTE — Therapy (Addendum)
OUTPATIENT PHYSICAL THERAPY SHOULDER TREATMENT AND DISCHARGE SUMMARY   PHYSICAL THERAPY DISCHARGE SUMMARY  Visits from Start of Care: 4  Current functional level related to goals / functional outcomes: SEE PROGRESS NOTE FOR DISCHARGE STATUS    Remaining deficits: UNKNOWN    Education / Equipment: HEP   Patient agrees to discharge. Patient goals were partially met. Patient is being discharged due to not returning since the last visit.  Kathleen Kennedy P. Helene Kelp PT, MPH 03/01/23 8:59 AM  Patient Name: Kathleen Kennedy MRN: [id number] DOB:[birth year], 67 y.o., female Today's Date: 12/29/2022  END OF SESSION:  PT End of Session - 12/29/22 1619     Visit Number 4    Number of Visits 16    Date for PT Re-Evaluation 02/13/23    Authorization Type BCBS    PT Start Time 1615    PT Stop Time 1700    PT Time Calculation (min) 45 min    Activity Tolerance Patient tolerated treatment well             Past Medical History:  Diagnosis Date   Hypertension    Past Surgical History:  Procedure Laterality Date   ROTATOR CUFF REPAIR  APRIL 2004   Patient Active Problem List   Diagnosis Date Noted   Grieving 12/15/2022   Trochanteric bursitis, left hip 07/20/2018   Effusion of right knee 07/03/2018   Bilateral shoulder pain 09/04/2017   Bilateral wrist pain 09/04/2017   Colon cancer screening 01/13/2016   Breast cancer screening 01/13/2016   Peroneal tendinitis of lower leg 12/30/2015   Seborrheic keratosis 12/30/2015   Elevated blood pressure 12/30/2015   Family history of colon cancer 01/28/2013   Avitaminosis D 01/28/2013   Colon polyp 01/09/2012    PCP: Teodoro Kil, PA-C  REFERRING PROVIDER: Dr Aundria Mems  REFERRING DIAG: Chronic bilat shoulder pain   THERAPY DIAG:  Chronic right shoulder pain  Chronic left shoulder pain  Abnormal posture  Other symptoms and signs involving the musculoskeletal system  Muscle weakness (generalized)  Rationale for  Evaluation and Treatment: Rehabilitation  ONSET DATE: 04/28/22  SUBJECTIVE:                                                                                                                                                                                      SUBJECTIVE STATEMENT:  Has worked on her exercises at home. Shoulder is still hurting and she is not getting any better. She has the pain deep in the shoulder and wonders if she has a rotator cuff tear. Worse with work and hurts at night when she is trying to sleep. She is using UE's all day at  work, lifting and reaching at times in awkward positions. Plans to order TENS unit for home.     PERTINENT HISTORY: Patient reports bilat shoulder pain over the past couple of years with symptoms increasing in the past 6 months. She does not know of any accident or injury. She has difficulty sleeping on her side or lifting arm to the side or in different ways. Rt UE is worse than the Lt. Had Lt RC surgery in 2004 but she never returned to full function of Lt UE but she can use the Lt UE for activities.Lt RCR 2004; covid 11/21; HTN; blood clots and pneumonia both lungs 11/21; stroke 4/22  PAIN:  Are you having pain? Yes: NPRS scale: 8-9/10; 0/10 at rest  Pain location: Rt shoulder  Pain description: sharp - center of shoulder  Aggravating factors: with movement Relieving factors: with rest  PRECAUTIONS: None  WEIGHT BEARING RESTRICTIONS: No  FALLS:  Has patient fallen in last 6 months? No  LIVING ENVIRONMENT: Lives with: lives with their spouse Lives in: House/apartment  OCCUPATION: Work FT in [employer] - sitting or standing; walking; arms forward at work; lifting up to 30 pounds - physical job. Has worked with current employer for 17 yrs similar work 15 yrs prior; household chores. Signed up for 5 K in April and wants to get back into running   PATIENT GOALS: get rid of pain; help get back to being more active and more physical    NEXT MD VISIT: to schedule after 6 weeks of PT   OBJECTIVE:   DIAGNOSTIC FINDINGS:  Rt shoulder xray - 12/15/22 - negative  Lt hip xray - 12/15/22 - Enthesopathic changes   PATIENT SURVEYS:  FOTO 37; goal 66  POSTURE: Patient presents with head forward posture with increased thoracic kyphosis; shoulders rounded and elevated; scapulae abducted and rotated along the thoracic spine; head of the humerus anterior in orientation.   UPPER EXTREMITY ROM:   Active ROM Right eval Left eval  Shoulder flexion 39 pain; tight 137 tight  Shoulder extension 67 tight ant shd  70 elbow tightness   Shoulder abduction 119 tight; pain 154  Shoulder adduction    Shoulder internal rotation T5 T5 tight  Shoulder external rotation 80 tight pain 90  Elbow flexion    Elbow extension    Wrist flexion    Wrist extension    Wrist ulnar deviation    Wrist radial deviation    Wrist pronation    Wrist supination    (Blank rows = not tested) Cervical ROM tight end ranges rotation and lateral flexion  UPPER EXTREMITY MMT:  MMT Right eval Left eval  Shoulder flexion 4 4+  Shoulder extension 4+ 4+  Shoulder abduction 4- 4  Shoulder adduction    Shoulder internal rotation 4+ 5-  Shoulder external rotation 4 4+  Middle trapezius 4 4  Lower trapezius 4 4  Elbow flexion    Elbow extension    Wrist flexion    Wrist extension    Wrist ulnar deviation    Wrist radial deviation    Wrist pronation    Wrist supination    Grip strength (lbs)    (Blank rows = not tested)  SHOULDER SPECIAL TESTS: Tight bilat pecs; biceps   PALPATION:  Significant muscular tightness Rt > Lt pecs; upper traps; ant/lat/post cervical musculature; thoracic paraspinals; teres; biceps    TODAY'S TREATMENT:  [facility] Adult PT Treatment:                                                 DATE:  12/29/22: Therapeutic Exercise: Chin tuck with noodle 5 sec x 5 Scap squeeze with noodle 5 sec x 10  L's with noodle x 10  W's with noodle x 10  Doorway stretch 3 positions 30 sec x 1  Prolonged snow angel arms ~ 80 deg abd x 2 min  Shoulder flexion table slide standing 5 sec x 5  Backward shoulder rolls  Shoulder ER bilat red TB 3 sec x 10 Row blue TB 3 sec x 10 Rt ER red TB 3 sec x 10  Rt IR red TB 3 3 sec x 10  Pulley flexion 5 sec x 5  Pulley horizontal ab/add x 15-20 Neuromuscular re-ed: Continued with postural correction and education, working on engaging posterior shoulder girdle musculature Therapeutic Activity: Sitting with noodle along spine  Self Care: (HEP)  Myofacial ball release work Rt/Lt shoulder girdle standing    DATE: 12/26/22 Therapeutic Exercise: Chin tuck with noodle 5 sec x 5 Scap squeeze with noodle 5 sec x 10  L's with noodle x 10  W's with noodle x 10  Doorway stretch 3 positions 30 sec x 1  Prolonged snow angel arms ~ 80 deg abd x 2 min  Shoulder flexion table slide standing 5 sec x 5  Backward shoulder rolls  Manual Therapy: Skilled palpation to assess response to manual work and DN; STM Rt anterior shoulder/arm and upper trap Trigger Point Dry-Needling  Treatment instructions: Expect mild to moderate muscle soreness. S/S of pneumothorax if dry needled over a lung field, and to seek immediate medical attention should they occur. Patient verbalized understanding of these instructions and education.  Patient Consent Given: Yes Education handout provided: Previously provided Muscles treated: Rt pecs; biceps; upper trap  Electrical stimulation performed: No Parameters: N/A Treatment response/outcome: decreased palpable tightness noted in all muscle groups treated  Neuromuscular re-ed: Continued postural correction and education engaging posterior shoulder girdle  Therapeutic Activity: Prolonged snow angel for home Modalities: TENS bilat shoulders  x 10 min  Moist heat cervical and shoulders x 10 min   Self Care: Myofacial ball release work Rt/Lt shoulder girdle standing  To avoid forward reach and lift activities     PATIENT EDUCATION: Education details: POC; HEP  Person educated: Patient Education method: Programmer, multimedia, Facilities manager, Actor cues, Verbal cues, and Handouts Education comprehension: verbalized understanding, returned demonstration, verbal cues required, tactile cues required, and needs further education  HOME EXERCISE PROGRAM: Access Code: [email or web address] URL: [email or web address] Date: 12/29/2022 Prepared by: Corlis Leak  Exercises - Seated Cervical Retraction  - 3 x daily - 7 x weekly - 1 sets - 10 reps - Standing Scapular Retraction  - 3 x daily - 7 x weekly - 1 sets - 10 reps - 10 hold - Shoulder External Rotation and Scapular Retraction  - 3 x daily - 7 x weekly - 1 sets - 10 reps -   hold - Seated Shoulder W  - 2 x daily - 7 x weekly - 1 sets - 10 reps - 3 sec  hold - Doorway Pec Stretch at 60 Degrees Abduction  - 3 x daily - 7 x weekly - 1 sets - 3 reps - Doorway Pec Stretch at  90 Degrees Abduction  - 3 x daily - 7 x weekly - 1 sets - 3 reps - 30 seconds  hold - Doorway Pec Stretch at 120 Degrees Abduction  - 3 x daily - 7 x weekly - 1 sets - 3 reps - 30 second hold  hold - Standing Pectoral Release with Ball at [place]  - 3-4 x daily - 7 x weekly - Supine Chest Stretch on Foam Roll  - 2 x daily - 7 x weekly - 1 sets - 1 reps - 2-5 min  sec  hold - Seated Bilateral Shoulder Flexion Towel Slide at Table Top  - 2-3 x daily - 7 x weekly - 1 sets - 3-5 reps - 5-10  sec  hold - Shoulder External Rotation and Scapular Retraction with Resistance  - 2 x daily - 7 x weekly - 1 sets - 10 reps - 3-5 sec  hold - Shoulder External Rotation Reactive Isometrics  - 1 x daily - 7 x weekly - 1 sets - 5-10 reps - 10-30 sec  hold - Shoulder Internal Rotation Reactive Isometrics  - 2 x daily - 7 x weekly - 1 sets - 10 reps  - 3-5 sec  hold - Standing Shoulder Row Reactive Isometric  - 2 x daily - 7 x weekly - 1 sets - 10 reps - 30-45 sec  hold - Seated Shoulder Flexion AAROM with Pulley Behind  - 2 x daily - 7 x weekly - 1 sets - 10 reps - 10 sec  hold - Seated Shoulder Scaption AAROM with Pulley at Side  - 2 x daily - 7 x weekly - 1 sets - 10 reps - 10sec  hold  Patient Education - TENS Unit - Trigger Point Dry Needling  ASSESSMENT:  CLINICAL IMPRESSION: Persistent pain which has not responded to treatment. Patient feels she irritates symptoms with work tasks. She uses her arms all day at work, often to reach forward with Rt UE and lift in an awkward position. She is working on exercises at home. She has continued Rt > Lt shoulder pain with movement and activity and she is having pain at night that prevents her from sleeping. Reviewed exercises and added isometric strengthening exercises to avoid increased pain and irritation of Rt shoulder. Trial of manual work and dry needling to address muscular tightness and pain at last visit was not effective. Rakisha plans to order TENS for home to address pain and tightness. Will hold PT pending RTD  OBJECTIVE IMPAIRMENTS: decreased activity tolerance, decreased mobility, decreased ROM, decreased strength, hypomobility, increased fascial restrictions, impaired flexibility, impaired UE functional use, improper body mechanics, postural dysfunction, and pain.   GOALS: Goals reviewed with patient? Yes  SHORT TERM GOALS: Target date: 01/16/2023  Independent in initial HEP  Baseline: Goal status: INITIAL  2.  Patient to demonstrate and reports improving posture and alignment with functional activities  Baseline:  Goal status: INITIAL  LONG TERM GOALS: Target date: 02/13/2023   Decrease Rt/Lt shoulder pain by 50-75% allowing patient to use UE's for more functional activities with less pain  Baseline:  Goal status: INITIAL  2.  Increase AROM Rt shoulder to =/> than AROM  Lt shoulder with no pain with AROM Baseline:  Goal status: INITIAL  3.  Increase strength bilat shoulders to 4+/5 to 5/5  Baseline:  Goal status: INITIAL  4.  Improve posture and alignment with patient to demonstrate improved upright posture with posterior shoulder girdle musculature engaged  Baseline:  Goal status: INITIAL  5.  Independent in HEP including aquatic program as indicated  Baseline:  Goal status: INITIAL  6.  Improve functional limitation score to 66 Baseline: 37 Goal status: INITIAL  PLAN:  PT FREQUENCY: 2x/week  PT DURATION: 8 weeks  PLANNED INTERVENTIONS: Therapeutic exercises, Therapeutic activity, Neuromuscular re-education, Patient/Family education, Self Care, Joint mobilization, Aquatic Therapy, Dry Needling, Electrical stimulation, Cryotherapy, Moist heat, Ultrasound, Ionotophoresis 4mg /ml Dexamethasone, Manual therapy, and Re-evaluation  PLAN FOR NEXT SESSION: review and progress HEP; continued postural correction and education; manual work, DN, modalities as indicated  - pending RTD    Everardo All, PT 12/29/2022, 5:02 PM

## 2023-01-03 ENCOUNTER — Ambulatory Visit: Payer: BC Managed Care – PPO | Admitting: Rehabilitative and Restorative Service Providers"

## 2023-01-05 ENCOUNTER — Encounter: Payer: BC Managed Care – PPO | Admitting: Physical Therapy

## 2023-01-10 ENCOUNTER — Encounter: Payer: BC Managed Care – PPO | Admitting: Rehabilitative and Restorative Service Providers"

## 2023-01-12 ENCOUNTER — Ambulatory Visit (INDEPENDENT_AMBULATORY_CARE_PROVIDER_SITE_OTHER): Payer: BC Managed Care – PPO | Admitting: Sports Medicine

## 2023-01-12 ENCOUNTER — Encounter: Payer: BC Managed Care – PPO | Admitting: Rehabilitative and Restorative Service Providers"

## 2023-01-12 DIAGNOSIS — M25512 Pain in left shoulder: Secondary | ICD-10-CM | POA: Diagnosis not present

## 2023-01-12 DIAGNOSIS — G8929 Other chronic pain: Secondary | ICD-10-CM | POA: Diagnosis not present

## 2023-01-12 DIAGNOSIS — M25511 Pain in right shoulder: Secondary | ICD-10-CM

## 2023-01-12 NOTE — Progress Notes (Signed)
    Procedures performed today:    None.  Independent interpretation of notes and tests performed by another provider:   None.  Brief History, Exam, Impression, and Recommendations:    Bilateral shoulder pain This is a very pleasant 67 year old female, she has a history of left rotator cuff surgery, she has been having bilateral shoulder pain, she has now done greater than 6 weeks of formal physical therapy, analgesics, consistent pain right shoulder worse than left, we will proceed with MRI of the right shoulder, if minimal rotator cuff tearing which I expect we will likely do an injection, if full-thickness retracted rotator cuff tear she will need an operation.    ____________________________________________ Gwen Her. Dianah Field, M.D., ABFM., CAQSM., AME. Primary Care and Sports Medicine [facility]  Adjunct Professor of [employer]  Risk manager

## 2023-01-12 NOTE — Assessment & Plan Note (Signed)
This is a very pleasant 68 year old female, she has a history of left rotator cuff surgery, she has been having bilateral shoulder pain, she has now done greater than 6 weeks of formal physical therapy, analgesics, consistent pain right shoulder worse than left, we will proceed with MRI of the right shoulder, if minimal rotator cuff tearing which I expect we will likely do an injection, if full-thickness retracted rotator cuff tear she will need an operation.

## 2023-01-23 ENCOUNTER — Ambulatory Visit (INDEPENDENT_AMBULATORY_CARE_PROVIDER_SITE_OTHER): Payer: BC Managed Care – PPO

## 2023-01-23 DIAGNOSIS — G8929 Other chronic pain: Secondary | ICD-10-CM

## 2023-01-23 DIAGNOSIS — M25511 Pain in right shoulder: Secondary | ICD-10-CM | POA: Diagnosis not present

## 2023-01-23 DIAGNOSIS — M19011 Primary osteoarthritis, right shoulder: Secondary | ICD-10-CM | POA: Diagnosis not present

## 2023-01-26 ENCOUNTER — Ambulatory Visit: Payer: BC Managed Care – PPO | Admitting: Sports Medicine

## 2023-01-26 ENCOUNTER — Ambulatory Visit (INDEPENDENT_AMBULATORY_CARE_PROVIDER_SITE_OTHER): Payer: BC Managed Care – PPO

## 2023-01-26 DIAGNOSIS — M25512 Pain in left shoulder: Secondary | ICD-10-CM

## 2023-01-26 DIAGNOSIS — G8929 Other chronic pain: Secondary | ICD-10-CM

## 2023-01-26 DIAGNOSIS — M25511 Pain in right shoulder: Secondary | ICD-10-CM

## 2023-01-26 MED ORDER — TRIAMCINOLONE ACETONIDE 40 MG/ML IJ SUSP
80.0000 mg | Freq: Once | INTRAMUSCULAR | Status: AC
  Administered 2023-01-26: 80 mg via INTRAMUSCULAR

## 2023-01-26 NOTE — Assessment & Plan Note (Signed)
Kathleen Kennedy returns, she is a pleasant 67 year old female, history of left rotator cuff surgery, she has been having bilateral shoulder pain, right significantly worse than left, she did do 6 weeks of formal therapy, she had analgesics, but persistent pain in the right shoulder, MRI did show significant rotator cuff tendinosis, articular sided fraying and glenohumeral osteoarthritis.  Kathleen Kennedy does admit that she could get more consistent with her home conditioning but she is in pain and she is interested in interventional treatment today, today I injected her glenohumeral joint and subacromial bursa, she will get more consistent with conditioning and I like to see her back in maybe 8 weeks.

## 2023-01-26 NOTE — Progress Notes (Signed)
    Procedures performed today:    Procedure: Real-time Ultrasound Guided injection of the right glenohumeral joint Device: Samsung HS60  Verbal informed consent obtained.  Time-out conducted.  Noted no overlying erythema, induration, or other signs of local infection.  Skin prepped in a sterile fashion.  Local anesthesia: Topical Ethyl chloride.  With sterile technique and under real time ultrasound guidance: Arthritic joint noted, 1 cc Kenalog 40, 2 cc lidocaine, 2 cc bupivacaine injected easily Completed without difficulty  Advised to call if fevers/chills, erythema, induration, drainage, or persistent bleeding.  Images permanently stored and available for review in PACS.  Impression: Technically successful ultrasound guided injection.  Procedure: Real-time Ultrasound Guided injection of the right subacromial bursa Device: Samsung HS60  Verbal informed consent obtained.  Time-out conducted.  Noted no overlying erythema, induration, or other signs of local infection.  Skin prepped in a sterile fashion.  Local anesthesia: Topical Ethyl chloride.  With sterile technique and under real time ultrasound guidance: Minimal articular fraying noted, 1 cc Kenalog 40, 1 cc lidocaine, 1 cc bupivacaine injected easily Completed without difficulty  Advised to call if fevers/chills, erythema, induration, drainage, or persistent bleeding.  Images permanently stored and available for review in PACS.  Impression: Technically successful ultrasound guided injection.  Independent interpretation of notes and tests performed by another provider:   None.  Brief History, Exam, Impression, and Recommendations:    Bilateral shoulder pain Kathleen Kennedy returns, she is a pleasant 67 year old female, history of left rotator cuff surgery, she has been having bilateral shoulder pain, right significantly worse than left, she did do 6 weeks of formal therapy, she had analgesics, but persistent pain in the right  shoulder, MRI did show significant rotator cuff tendinosis, articular sided fraying and glenohumeral osteoarthritis.  Kathleen Kennedy does admit that she could get more consistent with her home conditioning but she is in pain and she is interested in interventional treatment today, today I injected her glenohumeral joint and subacromial bursa, she will get more consistent with conditioning and I like to see her back in maybe 8 weeks.    ____________________________________________ Gwen Her. Dianah Field, M.D., ABFM., CAQSM., AME. Primary Care and Sports Medicine [facility]  Adjunct Professor of [employer] [facility] [employer]  Risk manager

## 2023-02-23 DIAGNOSIS — Z79899 Other long term (current) drug therapy: Secondary | ICD-10-CM | POA: Diagnosis not present

## 2023-02-23 DIAGNOSIS — R7303 Prediabetes: Secondary | ICD-10-CM | POA: Diagnosis not present

## 2023-02-23 DIAGNOSIS — I10 Essential (primary) hypertension: Secondary | ICD-10-CM | POA: Diagnosis not present

## 2023-02-23 DIAGNOSIS — E785 Hyperlipidemia, unspecified: Secondary | ICD-10-CM | POA: Diagnosis not present

## 2023-02-23 DIAGNOSIS — E559 Vitamin D deficiency, unspecified: Secondary | ICD-10-CM | POA: Diagnosis not present

## 2023-02-23 DIAGNOSIS — Z8673 Personal history of transient ischemic attack (TIA), and cerebral infarction without residual deficits: Secondary | ICD-10-CM | POA: Diagnosis not present

## 2023-02-23 DIAGNOSIS — R200 Anesthesia of skin: Secondary | ICD-10-CM | POA: Diagnosis not present

## 2023-02-23 DIAGNOSIS — Z1330 Encounter for screening examination for mental health and behavioral disorders, unspecified: Secondary | ICD-10-CM | POA: Diagnosis not present

## 2023-03-14 DIAGNOSIS — E875 Hyperkalemia: Secondary | ICD-10-CM | POA: Diagnosis not present

## 2023-03-28 ENCOUNTER — Ambulatory Visit: Payer: BC Managed Care – PPO | Admitting: Sports Medicine

## 2023-03-28 DIAGNOSIS — M25512 Pain in left shoulder: Secondary | ICD-10-CM | POA: Diagnosis not present

## 2023-03-28 DIAGNOSIS — M25511 Pain in right shoulder: Secondary | ICD-10-CM

## 2023-03-28 DIAGNOSIS — G8929 Other chronic pain: Secondary | ICD-10-CM

## 2023-03-28 NOTE — Assessment & Plan Note (Signed)
This is a pleasant six 67-year-old female, history of left rotator cuff surgery, bilateral shoulder pain right worse than left, she has done physical therapy, she has had analgesics, MRI showed significant rotator cuff tendinosis with articular sided fraying and glenohumeral osteoarthritis, we did a glenohumeral joint and subacromial injection approximately 2 months ago. She will get more consistent with conditioning as she still does have some worsening discomfort with abduction. Otherwise return to see me on an as-needed basis. She understands she can only get the shot once every 3 to 4 months.

## 2023-03-28 NOTE — Progress Notes (Signed)
    Procedures performed today:    None.  Independent interpretation of notes and tests performed by another provider:   None.  Brief History, Exam, Impression, and Recommendations:    Bilateral shoulder pain This is a pleasant six 67-year-old female, history of left rotator cuff surgery, bilateral shoulder pain right worse than left, she has done physical therapy, she has had analgesics, MRI showed significant rotator cuff tendinosis with articular sided fraying and glenohumeral osteoarthritis, we did a glenohumeral joint and subacromial injection approximately 2 months ago. She will get more consistent with conditioning as she still does have some worsening discomfort with abduction. Otherwise return to see me on an as-needed basis. She understands she can only get the shot once every 3 to 4 months.    ____________________________________________ Ihor Austin. Benjamin Stain, M.D., ABFM., CAQSM., AME. Primary Care and Sports Medicine [facility]  Adjunct Professor of Family Medicine  [place] [employer] [facility] [employer]  Restaurant manager, fast food

## 2023-05-04 DIAGNOSIS — E782 Mixed hyperlipidemia: Secondary | ICD-10-CM | POA: Diagnosis not present

## 2023-05-04 DIAGNOSIS — I10 Essential (primary) hypertension: Secondary | ICD-10-CM | POA: Diagnosis not present

## 2023-05-04 DIAGNOSIS — Z8673 Personal history of transient ischemic attack (TIA), and cerebral infarction without residual deficits: Secondary | ICD-10-CM | POA: Diagnosis not present

## 2023-05-04 DIAGNOSIS — R7303 Prediabetes: Secondary | ICD-10-CM | POA: Diagnosis not present

## 2023-07-25 DIAGNOSIS — R059 Cough, unspecified: Secondary | ICD-10-CM | POA: Diagnosis not present

## 2023-07-25 DIAGNOSIS — J069 Acute upper respiratory infection, unspecified: Secondary | ICD-10-CM | POA: Diagnosis not present

## 2023-07-25 DIAGNOSIS — I10 Essential (primary) hypertension: Secondary | ICD-10-CM | POA: Diagnosis not present

## 2023-07-25 DIAGNOSIS — R509 Fever, unspecified: Secondary | ICD-10-CM | POA: Diagnosis not present

## 2023-08-31 DIAGNOSIS — Z0000 Encounter for general adult medical examination without abnormal findings: Secondary | ICD-10-CM | POA: Diagnosis not present

## 2023-08-31 DIAGNOSIS — I10 Essential (primary) hypertension: Secondary | ICD-10-CM | POA: Diagnosis not present

## 2023-08-31 DIAGNOSIS — E559 Vitamin D deficiency, unspecified: Secondary | ICD-10-CM | POA: Diagnosis not present

## 2023-08-31 DIAGNOSIS — E785 Hyperlipidemia, unspecified: Secondary | ICD-10-CM | POA: Diagnosis not present

## 2023-08-31 DIAGNOSIS — Z8673 Personal history of transient ischemic attack (TIA), and cerebral infarction without residual deficits: Secondary | ICD-10-CM | POA: Diagnosis not present

## 2023-08-31 DIAGNOSIS — R7303 Prediabetes: Secondary | ICD-10-CM | POA: Diagnosis not present

## 2023-10-23 DIAGNOSIS — Z1231 Encounter for screening mammogram for malignant neoplasm of breast: Secondary | ICD-10-CM | POA: Diagnosis not present

## 2023-10-23 DIAGNOSIS — R92323 Mammographic fibroglandular density, bilateral breasts: Secondary | ICD-10-CM | POA: Diagnosis not present

## 2024-01-17 ENCOUNTER — Other Ambulatory Visit: Payer: Self-pay | Admitting: Sports Medicine

## 2024-01-17 DIAGNOSIS — G8929 Other chronic pain: Secondary | ICD-10-CM

## 2024-02-13 ENCOUNTER — Other Ambulatory Visit: Payer: Self-pay | Admitting: Sports Medicine

## 2024-02-13 DIAGNOSIS — G8929 Other chronic pain: Secondary | ICD-10-CM

## 2024-02-14 ENCOUNTER — Other Ambulatory Visit: Payer: Self-pay

## 2024-03-19 DIAGNOSIS — Z86711 Personal history of pulmonary embolism: Secondary | ICD-10-CM | POA: Diagnosis not present

## 2024-03-19 DIAGNOSIS — E785 Hyperlipidemia, unspecified: Secondary | ICD-10-CM | POA: Diagnosis not present

## 2024-03-19 DIAGNOSIS — Z1330 Encounter for screening examination for mental health and behavioral disorders, unspecified: Secondary | ICD-10-CM | POA: Diagnosis not present

## 2024-03-19 DIAGNOSIS — R7303 Prediabetes: Secondary | ICD-10-CM | POA: Diagnosis not present

## 2024-03-19 DIAGNOSIS — I10 Essential (primary) hypertension: Secondary | ICD-10-CM | POA: Diagnosis not present

## 2024-03-19 DIAGNOSIS — Z79899 Other long term (current) drug therapy: Secondary | ICD-10-CM | POA: Diagnosis not present

## 2024-03-19 DIAGNOSIS — Z8673 Personal history of transient ischemic attack (TIA), and cerebral infarction without residual deficits: Secondary | ICD-10-CM | POA: Diagnosis not present

## 2024-07-30 ENCOUNTER — Encounter: Payer: Self-pay | Admitting: Sports Medicine
